# Patient Record
Sex: Female | Born: 1975
Health system: Western US, Academic
[De-identification: ages and names within clinical notes are randomized; demographics above are authoritative.]

---

## 2022-03-21 ENCOUNTER — Ambulatory Visit: Payer: BC Managed Care – HMO

## 2022-03-23 ENCOUNTER — Telehealth: Payer: PRIVATE HEALTH INSURANCE

## 2022-03-23 ENCOUNTER — Ambulatory Visit: Payer: PRIVATE HEALTH INSURANCE | Attending: Student in an Organized Health Care Education/Training Program

## 2022-03-23 DIAGNOSIS — R030 Elevated blood-pressure reading, without diagnosis of hypertension: Secondary | ICD-10-CM

## 2022-03-23 DIAGNOSIS — M7532 Calcific tendinitis of left shoulder: Secondary | ICD-10-CM

## 2022-03-23 DIAGNOSIS — N3281 Overactive bladder: Secondary | ICD-10-CM

## 2022-03-23 DIAGNOSIS — E119 Type 2 diabetes mellitus without complications: Secondary | ICD-10-CM

## 2022-03-23 DIAGNOSIS — R195 Other fecal abnormalities: Secondary | ICD-10-CM

## 2022-03-23 DIAGNOSIS — Z0000 Encounter for general adult medical examination without abnormal findings: Secondary | ICD-10-CM

## 2022-03-23 DIAGNOSIS — M65312 Trigger thumb, left thumb: Secondary | ICD-10-CM

## 2022-03-23 DIAGNOSIS — R011 Cardiac murmur, unspecified: Secondary | ICD-10-CM

## 2022-03-23 DIAGNOSIS — N393 Stress incontinence (female) (male): Secondary | ICD-10-CM

## 2022-03-23 DIAGNOSIS — Z30430 Encounter for insertion of intrauterine contraceptive device: Secondary | ICD-10-CM

## 2022-03-23 DIAGNOSIS — M5416 Radiculopathy, lumbar region: Secondary | ICD-10-CM

## 2022-03-23 MED ORDER — SEMAGLUTIDE(0.25 OR 0.5MG/DOS) 2 MG/3ML SC SOPN
2 mg | SUBCUTANEOUS | 3 refills | Status: AC
Start: 2022-03-23 — End: ?

## 2022-03-23 MED ORDER — ATORVASTATIN CALCIUM 40 MG PO TABS
40 mg | ORAL_TABLET | Freq: Every day | ORAL | 3 refills | Status: AC
Start: 2022-03-23 — End: ?

## 2022-03-23 MED ORDER — LISINOPRIL 20 MG PO TABS
20 mg | ORAL_TABLET | Freq: Every day | ORAL | 3 refills | 30.00000 days | Status: AC
Start: 2022-03-23 — End: ?

## 2022-03-23 MED ORDER — TRIAMTERENE-HCTZ 37.5-25 MG PO CAPS
1 | ORAL_CAPSULE | Freq: Every day | ORAL | 3 refills | Status: AC
Start: 2022-03-23 — End: ?

## 2022-03-23 MED ORDER — MYRBETRIQ 25 MG PO TB24
25 mg | ORAL_TABLET | Freq: Every day | ORAL | 3 refills | Status: AC
Start: 2022-03-23 — End: ?

## 2022-03-23 NOTE — Progress Notes
[facility]  Outpatient Clinic Note    PATIENT:  Jamie Cohen  MRN:  [id number]  DOB:  1976-01-14  DATE OF SERVICE:  03/23/2022    PRIMARY CARE PROVIDER: Lanice Schwab, DO    Cc:    Chief Complaint   Patient presents with    Establish Care     Subjective:     Jamie Cohen is a a 47 y.o. female who presents with   Chief Complaint   Patient presents with    Establish Care     PMHx: HLD, HTN, Overactive Bladder and Stress Incontinence, T2DM   Ozempic x 1 year    Home BP WNL  Copper IUD replaced 06/2021    L hand dominant  R thumb trigger finger s/p 2 CSI last 2022    MRI lumbar spine and L shoulder reviewed  Hand Xrays reviewed     There are no problems to display for this patient.        Allergies   Allergen Reactions    Oxycodone        Medications that the patient states to be currently taking   Medication Sig    valACYclovir 1000 mg tablet          No past medical history on file.    No past surgical history on file.    No family history on file.        There is no immunization history on file for this patient.      Health Maintenance   Topic Date Due    Hepatitis B Screening  Never done    Hepatitis C Screening  Never done    HIV Screening  Never done    Tdap/Td Vaccine (1 - Tdap) Never done    Cervical Ca Screening: HPV Testing  Never done    Cervical Ca Screening: PAP Smear  Never done    COVID-19 Vaccine(Tracks primary and booster doses, not sup/immunocomp) (3 - Moderna series) 09/12/2019    Colorectal Cancer Screening  Never done    Influenza Vaccine (1) Never done    Breast Ca Screening: MAMMOGRAM  04/30/2023         Review of Systems:   ROS    Objective:  BP (!) 180/100  ~ Pulse 93  ~ Temp 36.2 ?C (97.1 ?F)  ~ Resp 16  ~ Wt (!) 279 lb (126.6 kg)  ~ SpO2 95%   Physical Exam        LABS:  No results found for: ''WBC'', ''HGB'', ''HCT'', ''MCV'', ''PLT''  No results found for: ''CREAT'', ''BUN'', ''NA'', ''K'', ''CL'', ''CO2''  No results found for: ''ALT'', ''AST'', ''GGT'', ''ALKPHOS'', ''BILITOT''  No results found for: ''CHOL'', ''CHOLDLCAL'', ''CHOLHDL'', ''TRIGLY''  No results found for: ''TSH''  No results found for: ''HGBA1C''                                   Assessment & Plan:   Diagnoses and all orders for this visit:    Encounter for medical examination to establish care  -     Referral to Obstetrics & Gynecology  -     Referral to Gastroenterology  -     CBC & Auto Differential; Future  -     Comprehensive Metabolic Panel; Future  -     HIV-1/2 Ag/Ab 4th Generation with Reflex Confirmation; Future  -     Lipid Panel;  Future  -     Vitamin D,25-Hydroxy; Future  -     Hgb A1c; Future  -     HCV Antibody Screen; Future  -     HBS Antigen; Future  -     TSH; Future  -     Free T4; Future  -     Vitamin B12; Future  -     Folate,Serum; Future  -     Iron & Iron Binding Capacity; Future  -     Ferritin; Future  -     Albumin/Creat Ratio Ur, LAB COLLECT; Future    Overactive bladder    Stress incontinence in female    Pelvic pain, Copper IUD in situ  -     US pelvis transabd and transvaginal; Future    Passage of loose stools    Type 2 diabetes mellitus treated without insulin (HCC/RAF)    Calcific tendinitis of left shoulder  -     Referral to Orthopaedic Surgery, [facility]    Trigger finger of left thumb  -     Referral to Orthopaedic Surgery, [facility]    Lumbar back pain with radiculopathy affecting right lower extremity  -     Referral to Orthopaedic Surgery, [facility]    Heart murmur  -     Referral to Cardiology    Blood pressure elevated without history of HTN  -     Referral to Cardiology    Other orders  -     semaglutide 0.25 or 0.5mg /dose (OZEMPIC) 2 mg/3 mL SC injection pen; Inject 2 mg under the skin once a week.  -     mirabegron (MYRBETRIQ) 25 mg ER tablet; Take 1 tablet (25 mg total) by mouth daily.  -     lisinopril 20 mg tablet; Take 1 tablet (20 mg total) by mouth daily.  -     atorvastatin (LIPITOR) 40 mg tablet; Take 1 tablet (40 mg total) by mouth daily.  -     triamterene-hydroCHLOROthiazide 37.5-25 mg capsule; Take 1 capsule by mouth daily.      Pap smear result record pending review  Cardiology records pending review     Follow-up: 1 wk after lab draw annual physical, repeat BP, PCV 20      Level of medical decision making based on the evaluation today:  Problem(s) addressed   []  minor/self-limited problem  []  acute uncomplicated illness/injury, or 1 stable chronic illness, or 2 or more self-limited/minor problems  [x]  acute systemic illness or complicated injury or 1 new undiagnosed problem with uncertain prognosis or > 2 stable chronic illness or >1 chronic illness with exacerbation/progression/se of tx  []  acute or chronic illness/injury that poses threat to life or bodily function or > 1 chronic illness with severe exacerbation/progression/or  se of tx     Risk of complication   []  Minimal  []  Low  [x]  Moderate  []  High       30 minutes were spent personally by me today on this encounter which include today's pre-visit review of the chart, obtaining appropriate history, performing an evaluation, documentation and discussion of management with details supported within the note for today's visit. The time documented was exclusive of any time spent on the separately billed procedure.       Signed,  Lanice Schwab, DO   Assistant Professor, [employer] at [facility] [employer] [facility]  Division of Internal Medicine  [facility] [address]  [place], [place] [address]  Tel: [phone]  Fx: [phone]    03/23/2022 9:12 AM

## 2022-03-31 ENCOUNTER — Telehealth: Payer: BC Managed Care – HMO

## 2022-03-31 NOTE — Telephone Encounter
Left V/M with pt asking to call in to schedule a follow up with Dr. Lia Hopping to discuss lab results

## 2022-04-01 ENCOUNTER — Ambulatory Visit: Payer: PRIVATE HEALTH INSURANCE | Attending: Student in an Organized Health Care Education/Training Program

## 2022-04-01 ENCOUNTER — Telehealth: Payer: PRIVATE HEALTH INSURANCE

## 2022-04-01 NOTE — Telephone Encounter
lvm to call back and reschedule appt on 02/07-02/09

## 2022-04-05 ENCOUNTER — Ambulatory Visit: Payer: PRIVATE HEALTH INSURANCE | Attending: Student in an Organized Health Care Education/Training Program

## 2022-04-13 ENCOUNTER — Telehealth: Payer: BC Managed Care – HMO

## 2022-04-14 NOTE — Addendum Note
Addended by: Napoleon Form on: 04/14/2022 08:11 AM     Modules accepted: Orders

## 2022-04-14 NOTE — Telephone Encounter
[facility] representative stated that the referral was created wrong, it should be for the RIGHT THUMB    They also stated the pt mentioned there was a missing referral, for the ankle, they did not specify

## 2022-04-14 NOTE — Addendum Note
Addended by: Napoleon Form on: 04/14/2022 08:12 AM     Modules accepted: Orders

## 2022-04-14 NOTE — Telephone Encounter
Reply by: Napoleon Form  Referrals fixed

## 2022-04-18 ENCOUNTER — Ambulatory Visit: Payer: BC Managed Care – HMO

## 2022-04-19 ENCOUNTER — Ambulatory Visit: Payer: PRIVATE HEALTH INSURANCE | Attending: Student in an Organized Health Care Education/Training Program

## 2022-04-22 ENCOUNTER — Ambulatory Visit: Payer: PRIVATE HEALTH INSURANCE

## 2022-04-25 ENCOUNTER — Ambulatory Visit: Payer: PRIVATE HEALTH INSURANCE

## 2022-04-25 DIAGNOSIS — M7532 Calcific tendinitis of left shoulder: Secondary | ICD-10-CM

## 2022-04-25 DIAGNOSIS — Z719 Counseling, unspecified: Secondary | ICD-10-CM

## 2022-04-25 DIAGNOSIS — M7550 Bursitis of unspecified shoulder: Secondary | ICD-10-CM

## 2022-04-25 DIAGNOSIS — M65311 Trigger thumb, right thumb: Secondary | ICD-10-CM
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
## 2022-04-26 ENCOUNTER — Ambulatory Visit: Payer: BC Managed Care – HMO

## 2022-04-27 ENCOUNTER — Ambulatory Visit: Payer: PRIVATE HEALTH INSURANCE

## 2022-04-28 ENCOUNTER — Telehealth: Payer: PRIVATE HEALTH INSURANCE | Attending: Gastroenterology

## 2022-04-28 ENCOUNTER — Ambulatory Visit: Payer: BC Managed Care – HMO | Attending: Student in an Organized Health Care Education/Training Program

## 2022-04-28 ENCOUNTER — Ambulatory Visit: Payer: PRIVATE HEALTH INSURANCE

## 2022-04-28 DIAGNOSIS — R195 Other fecal abnormalities: Secondary | ICD-10-CM

## 2022-04-28 DIAGNOSIS — R7303 Prediabetes: Secondary | ICD-10-CM

## 2022-04-28 DIAGNOSIS — I10 Essential (primary) hypertension: Secondary | ICD-10-CM

## 2022-04-28 DIAGNOSIS — Z0000 Encounter for general adult medical examination without abnormal findings: Secondary | ICD-10-CM

## 2022-04-28 DIAGNOSIS — Z1211 Encounter for screening for malignant neoplasm of colon: Secondary | ICD-10-CM

## 2022-04-28 DIAGNOSIS — E785 Hyperlipidemia, unspecified: Secondary | ICD-10-CM

## 2022-04-28 MED ORDER — NA SULFATE-K SULFATE-MG SULF 17.5-3.13-1.6 GM/177ML PO SOLN
0 refills | Status: AC
Start: 2022-04-28 — End: ?

## 2022-04-28 NOTE — Progress Notes
Gastroenterology Consult   ATTENDING: Raynelle Fanning Niklaus Mamaril   PATIENT: Jamie Cohen  MRN: [id number]  DOB: 02-05-1976  DATE OF SERVICE: 04/28/2022  REFERRING PROVIDER: Lanice Schwab, DO  PRIMARY CARE PROVIDER: Lanice Schwab, DO     REASON FOR REFERRAL/CHIEF COMPLAINT: Sensitive stomach     History of Present Illness:  Jamie Cohen is a 47 y.o. female with obesity, hypertension and hyperlipidemia who is here for evaluation of loose stools and colon cancer screening.    She reports that every time she eats, she gets the urge to go to the bathroom to empty. She has bowel movements multiple times per day and about 80% are after eating. No mucus or blood. She's had a fissure in the past. She uses Peptobismol with improvement.     No other gastrointestinal symptoms. She reports that the symptoms started about 5 years. These symptoms started occasionally but now is more frequent.    She's only tried a probiotic without helping. No other therapies.     She's never had an EGD or colonoscopy in the past.     No family history of gastrointestinal disorder or GI cancers.    Neg ROS otherwise      Past Medical & Surgical History:  Patient Active Problem List   Diagnosis    Calcific tendonitis of left shoulder    Subscapular bursitis      Past Medical History:   Diagnosis Date    Chicken pox     Headache     Heart murmur     Hyperlipidemia     Hypertension     Sexually transmitted infection     Sleep apnea      Past Surgical History:   Procedure Laterality Date    BREAST SURGERY       Relevant Family History:    []   Family history of colorectal cancer    []   Family history of GI cancer (not CRC)   []   Family history of IBD   []   Family history of celiac disease    Relevant Social History:    Social History     Socioeconomic History    Marital status: Unknown   Tobacco Use    Smoking status: Never    Smokeless tobacco: Never   Substance and Sexual Activity    Alcohol use: Yes     Comment: Rarely, few times a year    Drug use: Never    Sexual activity: Not Currently     Birth control/protection: IUD (non hormonal)   Other Topics Concern    Do you exercise at least a day, 3 or more days a week? No    Do you follow a special diet? No    Vegan? No    Vegetarian? No    Pescatarian? No    Lactose Free? No    Gluten Free? No    Omnivore? Yes     MEDICATIONS:     Medications that the patient states to be currently taking   Medication Sig    atorvastatin (LIPITOR) 40 mg tablet Take 1 tablet (40 mg total) by mouth daily.    lisinopril 20 mg tablet Take 1 tablet (20 mg total) by mouth daily.    mirabegron (MYRBETRIQ) 25 mg ER tablet Take 1 tablet (25 mg total) by mouth daily.    semaglutide 0.25 or 0.5mg /dose (OZEMPIC) 2 mg/3 mL injection pen     semaglutide 0.25 or 0.5mg /dose (OZEMPIC) 2 mg/3 mL SC injection  pen Inject 2 mg under the skin once a week.    triamterene-hydroCHLOROthiazide 37.5-25 mg capsule Take 1 capsule by mouth daily.    valACYclovir 1000 mg tablet       Allergies : is allergic to oxycodone.     PHYSICAL EXAM    There were no vitals filed for this visit.   There is no height or weight on file to calculate BMI.    System Check if examined and normal Positive or additional negative findings   Constit  []  General appearance - normal  Obese   Eyes  [x]  Conjuctivae clear       HENMT  []  Normal Lips/teeth/gums, oropharynx, head     Neck  [x]  Inspection     Resp  [x]  Effort good.      CV  []  Normal Rhythm/rate without murmur   []   Normal pulses     Abdomen  []  Abdomen soft and non-tender   []  Active bowel sounds 4 quadrants   []  No rebound/guarding   []  No appreciable flank or shifting dullness   []  No hepatosplenomegaly   []  No umbilical hernia    Rectal   []  No external hemorrhoids   []  No masses palpated on DRE   []  Brown stool in vault   []   Appropriate resting and squeeze pressure   []   Appropriate bear down maneuver      MSK  [x]  Grossly normal ROM     Neuro   [x]  Grossly normal      Psychiatric  [x]   Oriented to time, place and person   [x]   Appropriate insight/judgement & mood/affect              Lab Review:  Last CBC:   Results for orders placed or performed in visit on 03/30/22   CBC   Result Value Ref Range    White Blood Cell Count 8.32 4.16 - 9.95 x10E3/uL    Red Blood Cell Count 4.93 3.96 - 5.09 x10E6/uL    Hemoglobin 13.8 11.6 - 15.2 g/dL    Hematocrit 03.4 74.2 - 45.2 %    Mean Corpuscular Volume 87.4 79.3 - 98.6 fL    Mean Corpuscular Hemoglobin 28.0 26.4 - 33.4 pg    MCH Concentration 32.0 31.5 - 35.5 g/dL    Red Cell Distribution Width-SD 46.7 36.9 - 48.3 fL    Red Cell Distribution Width-CV 14.5 11.1 - 15.5 %    Platelet Count, Auto 499 (H) 143 - 398 x10E3/uL    Mean Platelet Volume 10.7 9.3 - 13.0 fL    Nucleated RBC%, automated 0.0 No Ref. Range %    Absolute Nucleated RBC Count 0.00 0.00 - 0.00 x10E3/uL    Neutrophil Abs (Prelim) 4.65 See Absolute Neut Ct. x10E3/uL   Differential, Automated   Result Value Ref Range    Neutrophil Percent, Auto 55.9 No Ref. Range %    Lymphocyte Percent, Auto 35.7 No Ref. Range %    Monocyte Percent, Auto 5.3 No Ref. Range %    Eosinophil Percent, Auto 2.3 No Ref. Range %    Basophil Percent, Auto 0.6 No Ref. Range %    Immature Granulocytes% 0.2 No Reference Range %    Absolute Neut Count 4.65 1.80 - 6.90 x10E3/uL    Absolute Lymphocyte Count 2.97 1.30 - 3.40 x10E3/uL    Absolute Mono Count 0.44 0.20 - 0.80 x10E3/uL    Absolute Eos Count 0.19 0.00 - 0.50 x10E3/uL    Absolute  Baso Count 0.05 0.00 - 0.10 x10E3/uL    Absolute Immature Gran Count 0.02 0.00 - 0.04 x10E3/uL   CBC & Auto Differential    Narrative    The following orders were created for panel order CBC & Auto Differential.  Procedure                               Abnormality         Status                     ---------                               -----------         ------                     GEX[[id number]]                          Abnormal            Final result               Differential, Automated[[id number]] Final result                 Please view results for these tests on the individual orders.       Last CMP:   Results for orders placed or performed in visit on 03/30/22   Comprehensive Metabolic Panel   Result Value Ref Range    Sodium 137 135 - 146 mmol/L    Potassium 4.7 3.6 - 5.3 mmol/L    Chloride 101 96 - 106 mmol/L    Total CO2 25 20 - 30 mmol/L    Anion Gap 11 8 - 19 mmol/L    Glucose 124 (H) 65 - 99 mg/dL    Creatinine 0.10 2.72 - 1.30 mg/dL    Estimated GFR >53 See GFR Additional Information mL/min/1.9m2    GFR Additional Information See Comment     Urea Nitrogen 14 7 - 22 mg/dL    Calcium 9.6 8.6 - 66.4 mg/dL    Total Protein 8.1 6.1 - 8.2 g/dL    Albumin 4.2 3.9 - 5.0 g/dL    Bilirubin,Total 0.4 0.1 - 1.2 mg/dL    Alkaline Phosphatase 83 37 - 113 U/L    Aspartate Aminotransferase 26 13 - 62 U/L    Alanine Aminotransferase 19 8 - 70 U/L     Last BMP:    Last LFT:   Lab Results   Component Value Date/Time    TOTPRO 8.1 03/30/2022 08:52 AM    ALBUMIN 4.2 03/30/2022 08:52 AM    BILITOT 0.4 03/30/2022 08:52 AM    ALKPHOS 83 03/30/2022 08:52 AM    AST 26 03/30/2022 08:52 AM    ALT 19 03/30/2022 08:52 AM     Last Cal/iCalMg/Ph:   Lab Results   Component Value Date/Time    CALCIUM 9.6 03/30/2022 08:52 AM     Last Hb A1C:   Lab Results   Component Value Date/Time    HGBA1C 6.4 (H) 03/30/2022 08:52 AM     Last TSH:   Lab Results   Component Value Date/Time    TSH 2.0 03/30/2022 08:52 AM     Last vitamin D:   Lab Results   Component Value Date/Time  VITD25OH 27 03/30/2022 08:52 AM     Last ferritin:   Results for orders placed or performed in visit on 03/30/22   Ferritin   Result Value Ref Range    Ferritin 118 8 - 180 ng/mL     Last TIBC, iron saturation:   Results for orders placed or performed in visit on 03/30/22   Iron & Iron Binding Capacity   Result Value Ref Range    Iron 76 41 - 179 mcg/dL    Iron Binding Capacity 368 262 - 502 mcg/dL    % Saturation 21 %     Last vitamin B12:   Results for orders placed or performed in visit on 03/30/22   Vitamin B12   Result Value Ref Range    Vitamin B12 754 254 - 1,060 pg/mL     Last folate:   Results for orders placed or performed in visit on 03/30/22   [facility]   Result Value Ref Range    Folate,Serum 14.5 8.1 - 30.4 ng/mL     Last ESR:    Last CRP:    Last coags: No results found for: ''PT'', ''APTT'', ''INR''  Last MTB quantiferon gold:    Last celiac Ab panel:    Last fecal calprotectin:    Last C. diff testing:    Last parasite panel:    Last bacterial pathogen panel:      Imaging/GI Studies:  Last Colonoscopy:  , Last EGD:  , Last CT abd/pelvis w/ contrast:  , and Last abdominal ultrasound:         Review of Data:   I have   [x]  Reviewed/ordered []  1 [x]  2 []  ? 3 unique laboratory, radiology, and/or diagnostic tests noted below   [x]  Reviewed [x]  1 []  2 []  ? 3 prior external notes and incorporated into patient assessment   []  Discussed management or test interpretation with external provider(s) as noted       VISIT DIAGNOSES/PROBLEMS ADDRESSED ON 04/28/2022:   Loose stools  Colon cancer screening  Healthcare maintenance    []  New minor/self-limited problem  []  Acute uncomplicated illness/injury   []  Acute systemic illness or complicated injury   []  New problem with uncertain diagnosis  []  New problem that poses threat to life or bodily function      Severity  Not stable/at goal    Social Determinants of Health:  [x]  The diagnosis or treatment of said conditions is significantly limited by social determinants of health     Risk of Complication:   The above diagnoses are deemed to have a risk of complication, morbidity or mortality of:   []  Minimal;    []  Low;     [x]  Moderate;     []  Severe     Assessment & Plan :   Jamie Cohen is a 47 y.o. female with obesity, hypertension and hyperlipidemia who is here for evaluation of loose stools and colon cancer screening.    #Loose stools  - Chronic and occurs after food  - I will order celiac panel  - I will order fecal elastase  - I will order breath testing to rule out SIBO  - We'll do colonoscopy for further evaluation as well per below  - I did recommend adding one tsp of metamucil daily to bulk up the stools  - I also recommended using probiotics daily; recommended either garden of life or VSL    #Colon cancer screening  - I will schedule her for colonoscopy  for screening  - Colonoscopy was discussed in detail with the patient. Risks, benefits and alternatives were also discussed. Risks include, but are not limited to, bleeding, perforation, adverse medication/sedation reaction, and missing lesions including malignancy. We discussed the need for a driver due to sedation. The colonoscopy will be scheduled with MAC and the bowel prep to be used will be Suprep. All questions were answered and the patient agreed to proceed.      - Follow-up after the colonoscopy      The above plan/recommendation(s) were discussed with the patient. The patient had all questions answered satisfactorily and is in agreement with this recommended plan of care.    Wynne Dust, MD   04/28/2022 8:38 AM

## 2022-04-28 NOTE — Progress Notes
[facility]  [address]  [address]  [place], [place] [address]  [phone] Patient:  DOB:  MRN:  Date:  PCP: Jamie Cohen  07/19/1975   [id number]   04/28/2022  Lanice Schwab, DO        CARDIOLOGY OUTPATIENT HISTORY AND PHYSICAL EXAMINATION    REQUESTING PHYSICIAN.  Hadj Rowe Robert, DO    REASON FOR CONSULTATION: HTN    HISTORY OF PRESENT ILLNESS:  Jamie Cohen is a 47 y.o. female with a past medical history significant for HTN, HL who presents for comprehensive cardiovascular evaluation.    Just moved to [place] from [place]. History of heart murmur. Hx of HTN and HL. Automated BP cuffs dont work for her.     Denies chest pain, shortness of breath, dyspnea on exertion, orthopnea, paroxysmal noctural dyspnea, leg swelling, palpitations, lightheadedness, near syncope or syncope.    Exercise: fairly sedentary, busy from work.     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 No drugs. Rare alcohol. No children. One pregnancy. - no complications.     Family history: dad had CHF in old age. Mom died of kidney disease.    Additional ROS negative unless otherwise noted.    MEDICATIONS.   Current Outpatient Medications   Medication Sig    atorvastatin (LIPITOR) 40 mg tablet Take 1 tablet (40 mg total) by mouth daily.    lisinopril 20 mg tablet Take 1 tablet (20 mg total) by mouth daily.    mirabegron (MYRBETRIQ) 25 mg ER tablet Take 1 tablet (25 mg total) by mouth daily.    semaglutide 0.25 or 0.5mg /dose (OZEMPIC) 2 mg/3 mL injection pen     semaglutide 0.25 or 0.5mg /dose (OZEMPIC) 2 mg/3 mL SC injection pen Inject 2 mg under the skin once a week.    sodium sulfate-potassium sulfate-magnesium sulfate solution At 6pm the evening before colonoscopy, drink 16oz x1 dose, then drink 32oz water over 1 hour. At 5am the day of colonoscopy, repeat above.    triamterene-hydroCHLOROthiazide 37.5-25 mg capsule Take 1 capsule by mouth daily.    valACYclovir 1000 mg tablet      No current facility-administered medications for this visit.      ALLERGIES.  Allergies   Allergen Reactions    Oxycodone      PAST MEDICAL HISTORY.  Past Medical History:   Diagnosis Date    Chicken pox     Headache     Heart murmur     Hyperlipidemia     Hypertension     Sexually transmitted infection     Sleep apnea      PAST SURGICAL HISTORY.  Past Surgical History:   Procedure Laterality Date    BREAST SURGERY       FAMILY HISTORY.   Family History   Problem Relation Age of Onset    Hypothyroidism Mother     Kidney disease Father     Hypothyroidism Sister      SOCIAL HISTORY.   Social History     Socioeconomic History    Marital status: Unknown   Tobacco Use    Smoking status: Never    Smokeless tobacco: Never   Substance and Sexual Activity    Alcohol use: Yes     Comment: Rarely, few times a year    Drug use: Never    Sexual activity: Not Currently     Birth control/protection: IUD (non hormonal)   Other Topics Concern    Do you  exercise at least a day, 3 or more days a week? No    Do you follow a special diet? No    Vegan? No    Vegetarian? No    Pescatarian? No    Lactose Free? No    Gluten Free? No    Omnivore? Yes       PHYSICAL EXAMINATION.    Vitals Current      Temp           BP     150/70       HR    85      RR           Sats    94 %      Weight       There is no height or weight on file to calculate BMI.     System Check if normal Positive or additional negative findings   Constit  [x]  General appearance     Eyes  []  Conj/Lids []  Pupils  []  Fundi     HEENT  [x]  External ears/nose []  Otoscopy   [x]  Gross Hearing []  Nasal mucosa   []  Lips/teeth/gums []  Oropharynx    []  mucus membranes [x]  Head     Neck  []  Inspection/palpation []  Thyroid     Resp  [x]  Effort []  Wheezing    [x]  Auscultation  []  Crackles     CV  [x]  Rhythm/rate   []  Murmurs   []  Lower extremity edema   [x]  JVP non-elevated    [x]  Carotid  auscultation normal   Normal pulses:   [x]  Radial []  Femoral  []  Pedal      GI  []  abd masses []  tenderness   []  rebound/guarding   []  Liver/spleen []  Rectal     Neuro  []  CN2-12 intact grossly   [x]  Alert and oriented   []  DTR      []  Muscle strength      []  Sensation   []  Gait/balance     Psych  [x]  Insight/judgement     []  Mood/affect    [x]  Gross cognition          LABORATORY STUDIES.   Reviewed during this encounter    Complete Blood Count Metabolic Panel Lipid Analysis   Lab Results   Component Value Date    WBC 8.32 03/30/2022    HGB 13.8 03/30/2022    HCT 43.1 03/30/2022    PLT 499 (H) 03/30/2022    Lab Results   Component Value Date    NA 137 03/30/2022    K 4.7 03/30/2022    BUN 14 03/30/2022    CREAT 0.70 03/30/2022    GLUCOSE 124 (H) 03/30/2022    AST 26 03/30/2022    ALT 19 03/30/2022    ALKPHOS 83 03/30/2022    BILITOT 0.4 03/30/2022    Lab Results   Component Value Date    CHOL 193 03/30/2022    CHOLDLCAL 126 (H) 03/30/2022    CHOLHDL 44 (L) 03/30/2022    NOHDLCHOCAL 149 (H) 03/30/2022    TRIGLY 114 03/30/2022      No results found for: ''TROPONIN'', ''BNP''  Lab Results   Component Value Date    HGBA1C 6.4 (H) 03/30/2022       DIAGNOSTIC STUDIES.    TTE Reviewed by me  No results found.     ACC/AHA Cholesterol Management Guideline Recommendations:   Advises that moderate-statin therapy can be considered because 10 year  ASCVD risk is between 5% and 10%. Patient is currently receiving guideline-directed statin therapy.    ASCVD Risk Score    10-year ASCVD risk  is 7.08% as of 1:43 PM on 04/28/2022.   10-year ASCVD risk with optimal risk factors  is 0.5%.   Values used to calculate ASCVD Risk Score    Age  61 y.o.     Gender  female   Race  African American   HDL Cholesterol  44 mg/dL. (measured on 03/30/2022)   LDL Cholesterol  126 mg/dL. (measured on 03/30/2022)   Total Cholesterol  193 mg/dL. (measured on 03/30/2022)   Systolic Blood Pressure  150 mm Hg. (measured on 04/28/2022)   Blood Pressure Medication Present  Yes   Smoking Status  currently not a smoker   Diabetes Present  No     Click here for the [facility] ASCVD Cardiovascular Risk Estimator Plus tool Office manager).         ECG 04/28/22 Reviewed by me: Normal sinus rhythm 81 bpm,       IMPRESSION  Jamie Cohen is a 47 y.o. female with:  HTN  HL - LDL 126 without statin  prediabetes    RECOMMENDATIONS/PLAN  - lisinopril 20 mg  - triamterene-HCTZ 37.5-25 mg -> takes half tablet  - atorvastatin 40 mg -> repeat lipids and A1C around 07/2022  - echo and stress echo (given sedentary)  - Counseled patient on importance of diet and exercise and encouraged adherence to a heart-healthy diet.  - return visit after above studies done    The above recommendations were discussed in detail with the patient.  All questions were answered satisfactorily and the patient is in full agreement with this recommended plan of care.    Thank you, Lanice Schwab, DO for involving me in your patient's care.  Please feel free to contact me with any questions or concerns.    AUTHOR:   Roseanne Reno, MD  Clinical Instructor   [facility]  [address]  [address]  [place], [place] [address]  [phone]    04/28/2022 1:43 PM    Complex medical decision making

## 2022-04-29 ENCOUNTER — Ambulatory Visit: Payer: PRIVATE HEALTH INSURANCE | Attending: Student in an Organized Health Care Education/Training Program

## 2022-04-29 ENCOUNTER — Telehealth: Payer: PRIVATE HEALTH INSURANCE

## 2022-04-29 ENCOUNTER — Telehealth: Payer: BC Managed Care – HMO

## 2022-04-29 DIAGNOSIS — Z6841 Body Mass Index (BMI) 40.0 and over, adult: Secondary | ICD-10-CM

## 2022-04-29 DIAGNOSIS — D75839 Thrombocytosis: Secondary | ICD-10-CM

## 2022-04-29 DIAGNOSIS — Z7182 Exercise counseling: Secondary | ICD-10-CM

## 2022-04-29 DIAGNOSIS — Z713 Dietary counseling and surveillance: Secondary | ICD-10-CM

## 2022-04-29 DIAGNOSIS — E7800 Pure hypercholesterolemia, unspecified: Secondary | ICD-10-CM

## 2022-04-29 DIAGNOSIS — E119 Type 2 diabetes mellitus without complications: Secondary | ICD-10-CM

## 2022-04-29 MED ORDER — MOUNJARO 7.5 MG/0.5ML SC SOPN
7.5 mg | SUBCUTANEOUS | 0 refills | Status: AC
Start: 2022-04-29 — End: ?

## 2022-04-29 NOTE — Progress Notes
[facility]  Outpatient Clinic Note    PATIENT:  Jamie Cohen  MRN:  [id number]  DOB:  1975/03/29  DATE OF SERVICE:  04/29/2022    PRIMARY CARE PROVIDER: Lanice Schwab, DO    Cc:    Chief Complaint   Patient presents with    Follow-up     Lab results and would like to note with Lipitor needs brand not generic     Subjective:     Jamie Cohen is a a 47 y.o. female who presents with   Chief Complaint   Patient presents with    Follow-up     Lab results and would like to note with Lipitor needs brand not generic     PMHx: HLD, HTN, Overactive Bladder and Stress Incontinence, T2DM   Ozempic x 1 year, tolerated titration, did not notice targeted weight loss from initial weight 280 lbs     Home BP WNL  Copper IUD replaced 06/2021    L hand dominant  R thumb trigger finger s/p 2 CSI last 2022    MRI lumbar spine and L shoulder reviewed  Hand Xrays reviewed     Patient Active Problem List    Diagnosis Date Noted    Calcific tendonitis of left shoulder 04/25/2022    Subscapular bursitis 04/25/2022           Allergies   Allergen Reactions    Atorvastatin Diarrhea and Nausea And Vomiting     Patient states tolerates BRAND only    Oxycodone        Medications that the patient states to be currently taking   Medication Sig    atorvastatin (LIPITOR) 40 mg tablet Take 1 tablet (40 mg total) by mouth daily.    lisinopril 20 mg tablet Take 1 tablet (20 mg total) by mouth daily.    mirabegron (MYRBETRIQ) 25 mg ER tablet Take 1 tablet (25 mg total) by mouth daily.    semaglutide 0.25 or 0.5mg /dose (OZEMPIC) 2 mg/3 mL SC injection pen Inject 2 mg under the skin once a week.    triamterene-hydroCHLOROthiazide 37.5-25 mg capsule Take 1 capsule by mouth daily.    valACYclovir 1000 mg tablet          Past Medical History:   Diagnosis Date    Chicken pox     Headache     Heart murmur     Hyperlipidemia     Hypertension     Sexually transmitted infection     Sleep apnea        Past Surgical History:   Procedure Laterality Date BREAST SURGERY         Family History   Problem Relation Age of Onset    Hypothyroidism Mother     Kidney disease Father     Hypothyroidism Sister          Immunization History   Administered Date(s) Administered    COVID-19, mRNA, (Moderna) 100 mcg/0.5 mL 06/20/2019, 07/18/2019         Health Maintenance   Topic Date Due    Tdap/Td Vaccine (1 - Tdap) Never done    Cervical Ca Screening: HPV Testing  Never done    Cervical Ca Screening: PAP Smear  Never done    Colorectal Cancer Screening  Never done    Influenza Vaccine (1) 10/29/2021    COVID-19 Vaccine(Tracks primary and booster doses, not sup/immunocomp) (3 - 2023-24 season) 10/29/2021    Breast Ca Screening: MAMMOGRAM  04/30/2023  Prediabetes Screening (See hover text)  03/30/2025    Hepatitis B Screening  Completed    Hepatitis C Screening  Completed    HIV Screening  Completed         Review of Systems:   ROS    Objective:  BP 134/85 (BP Location: Right arm, Patient Position: Sitting, Cuff Size: Large)  ~ Pulse 75  ~ Temp 36.6 ?C (97.8 ?F) (Temporal)  ~ Resp 18  ~ Ht 5' 4'' (1.626 m)  ~ Wt (!) 278 lb 12.8 oz (126.5 kg)  ~ LMP 03/28/2022 (Within Days)  ~ SpO2 97%  ~ BMI 47.86 kg/m?   Physical Exam        LABS:  Lab Results   Component Value Date    WBC 8.32 03/30/2022    HGB 13.8 03/30/2022    HCT 43.1 03/30/2022    MCV 87.4 03/30/2022    PLT 499 (H) 03/30/2022     Lab Results   Component Value Date    CREAT 0.70 03/30/2022    BUN 14 03/30/2022    NA 137 03/30/2022    K 4.7 03/30/2022    CL 101 03/30/2022    CO2 25 03/30/2022     Lab Results   Component Value Date    ALT 19 03/30/2022    AST 26 03/30/2022    ALKPHOS 83 03/30/2022    BILITOT 0.4 03/30/2022     Lab Results   Component Value Date    CHOL 193 03/30/2022    CHOLDLCAL 126 (H) 03/30/2022    CHOLHDL 44 (L) 03/30/2022    TRIGLY 114 03/30/2022     Lab Results   Component Value Date    TSH 2.0 03/30/2022     Lab Results   Component Value Date    HGBA1C 6.4 (H) 03/30/2022 Assessment & Plan:   Diagnoses and all orders for this visit:    Type 2 diabetes mellitus treated without insulin (HCC/RAF)  -     tirzepatide (MOUNJARO) 7.5 mg/0.5 mL injection; Inject 0.5 mLs (7.5 mg total) under the skin once a week.  -     Hgb A1c; Standing    Pure hypercholesterolemia  -     tirzepatide (MOUNJARO) 7.5 mg/0.5 mL injection; Inject 0.5 mLs (7.5 mg total) under the skin once a week.  -     Hgb A1c; Standing    Thrombocytosis  -     Hgb A1c; Standing  -     Referral to Hematology    BMI 45.0-49.9, adult (HCC/RAF)  -     tirzepatide (MOUNJARO) 7.5 mg/0.5 mL injection; Inject 0.5 mLs (7.5 mg total) under the skin once a week.    Annual physical exam    Dietary counseling    Exercise counseling    > HgbA1C at goal <7% on Ozempic   D/C Ozempic given no response to weight loss x 1 year; Switch to Mounjaro 7.5 mg weekly with plan to titrate dose monthly as tolerated   Continue Lisinopril 20 mg daily and Statin therapy  PCV 20 at next visit   Pap smear result record pending review  Cardiology records pending review     Follow-up: 1 month Mounjaro titration, 3 months weight check, A1C and  PCV 20 ( annual physical)     Level of medical decision making based on the evaluation today:  Problem(s) addressed   []  minor/self-limited problem  []  acute uncomplicated illness/injury, or 1 stable chronic illness, or 2 or more  self-limited/minor problems  [x]  acute systemic illness or complicated injury or 1 new undiagnosed problem with uncertain prognosis or > 2 stable chronic illness or >1 chronic illness with exacerbation/progression/se of tx  []  acute or chronic illness/injury that poses threat to life or bodily function or > 1 chronic illness with severe exacerbation/progression/or  se of tx     Risk of complication   []  Minimal  []  Low  [x]  Moderate  []  High       30 minutes were spent personally by me today on this encounter which include today's pre-visit review of the chart, obtaining appropriate history, performing an evaluation, documentation and discussion of management with details supported within the note for today's visit. The time documented was exclusive of any time spent on the separately billed procedure.       Signed,  Lanice Schwab, DO   Assistant Professor, [employer] at [facility] [employer] [facility]  Division of Internal Medicine    [facility] [address]  [place], [place] [address]  Tel: [phone]  Fx: [phone]    04/29/2022 2:03 PM

## 2022-04-29 NOTE — Telephone Encounter
Hi Team,    Please reach out to patient to schedule procedure    Thank you!

## 2022-04-30 ENCOUNTER — Ambulatory Visit: Payer: PRIVATE HEALTH INSURANCE

## 2022-04-30 NOTE — Telephone Encounter
Reached out to confirm pts follow up appointment changing to annual physical per MD

## 2022-05-02 ENCOUNTER — Ambulatory Visit: Payer: PRIVATE HEALTH INSURANCE

## 2022-05-03 ENCOUNTER — Ambulatory Visit: Payer: BC Managed Care – HMO

## 2022-05-04 ENCOUNTER — Telehealth: Payer: PRIVATE HEALTH INSURANCE

## 2022-05-04 NOTE — Progress Notes
Date:05/10/2022  Name:Jamie Cohen  MRN: [id number]  DOB: September 26, 1975  Age: 47 y.o.      Jamie Arave A.  Jamie Fusi, MD    The patient was seen in our office today.     Jamie Cohen was seen in my office today 05/10/2022 for Orthopedic Consultation at the request of Jamie Cohen, Jamie Seton, DO for my opinion and advice on her complaints of right hand pain.    Chief Complaint: Right hand pain    History: Jamie Cohen presents today regarding the above chief complaints. Jamie Cohen is a 47 y.o. female who presents with a 2 month history of symptoms after no injury.    The pain is localized in the right hand. She notes swelling and notes numbness. Moreover, the symptoms are exacerbated by use and are alleviated by rest.    Patient Medical History: The patient?s intake sheet, including past medical history, past surgical history, medicines, allergies, social and family history was reviewed by myself and the patient today, these are noted and documented in the patient?s file.    Review of Systems: The review of systems is remarkable for the positive orthopedic problems discussed above. A full review of systems with respect to Constitutional, ENT, Cardiovascular, GU, Skin, Neurologic, Endocrine, Hematologic, Psychiatric, Gastrointestinal, Respiratory, Eyes and Allergic/Immunologic systems is noted, reviewed and documented in the patient?s file.    Physical Exam: The patient is 5 feet 4 inches    Constitutional:  Well appearing, no apparent distress.  The patient?s general appearance is well dressed well nourished. The body habitus is normal.    Psychiatric: Patient is alert and oriented with a pleasant mood and affect.      Eyes: Extraocular movements are intact, pupils are symmetric.     Neck: Supple, trachea midline.      Vascular: Fingers pink warm and well perfused.     Respiratory: Respirations are regular & unlabored. Respiratory effort is normal.    Skin: No lesions or rash noted. Normal color temperature and texture. Intact.    Musculoskeletal and Neurological: Sensation intact to light touch. Flexion and extension grossly intact bilateral upper extremities.     + Tinel right carpal tunnel.  Tenderness, swelling, and cyst right thumb flexor sheath.    Test Results: None.     Radiographs: 3 views of the right hand which were ordered, obtained and evaluated by me here in the [facility] office today shows no evidence of fracture, arthritis or subluxation.    Impression:  Right carpal tunnel syndrome  Right thumb flexor tenosynovitis with cyst    Plan: Findings, diagnoses, prognosis, and treatment options were reviewed and discussed with the patient, with all questions answered.    Plan for an electrodiagnostic study of the right upper extremity.    Submit authorization request for steroid injections of the right carpal tunnel and right thumb flexor sheath and cyst.    Patient elected to proceed with injection under ultrasound guidance.      Prior to the procedure, the risks, benefits, alternatives to care, and potential complications, including possible permanent skin hypopigmentation and fat/skin atrophy as well as infection or injury to neurovascular or other critical structures were discussed with patient and informed consent was obtained.     The Sonosite M-MSK portable ultrasound unit with a MSK probe was used to perform a standard, dynamic ultrasound.     Under direct ultrasound guidance, the right thumb flexor sheath and cyst were visualized.  Under sterile technique, a 25 gauge needle was used  to inject 1cc Kenalog 40mg  and 1cc 1% lidocaine into the right thumb flexor sheath and cyst, ensuring correct needle placement and injection, without complication.    Next Appointment: 1-2 weeks CL/KW  [x]  Reassessment of pain  [x]  No X-Ray  []  X-rays     I recommend a course of treatment as checked below:     []  Splinting/Bracing/Immobilization   [x]  Aspiration/Injection  []  Fracture Care  []  Decision for Surgery  []  Referral to PT/OT for formal program  []  Imaging study  [x]  Consultation for electrodiagnostic studies  []  Inflammatory labs  []  Prescription: Meloxicam 7.5mg  orally once daily for 30 days  []  Prescription: Norco 5/325mg  as needed orally once every 6 hours for pain  []  Prescription: Tramadol 50mg  as needed orally every 6 hours for pain  []  Prescription: Voltaren Gel        Sincerely,         ___________________  Jamie Cohen. Jamie Cohen, M.D.  Hand and Upper Extremity Specialist, Orthopedic Surgery    CC: Jamie Schwab, DO

## 2022-05-04 NOTE — Telephone Encounter
Left VM for patient to call [facility] scheduling team back. If the patient returns our call to a different office, please transfer call to [phone] so that we may assist. This number is for internal use only; please do not give this number to patients.  If pt would like to call back provide [phone].  Thank you.

## 2022-05-04 NOTE — Telephone Encounter
-----   Message from Laury Axon, MD sent at 04/28/2022  8:58 AM PST -----  Can you schedule patient for colonoscopy with me at [facility]? Thank you.

## 2022-05-09 NOTE — Telephone Encounter
PRIOR AUTHORIZATION:  SUBMITTED/PENDING  Medication:  Mounjaro  Submitted Prior Auth for medication for patient through [email or web address]   Josem Kaufmann Code: [id number])  Date submitted:  05/09/2022   Patient has been notified of submission.    Jacqualyn Posey de Milbert Coulter 05/09/2022 1:40 PM

## 2022-05-10 ENCOUNTER — Ambulatory Visit: Payer: BC Managed Care – HMO | Attending: Hand Surgery

## 2022-05-10 DIAGNOSIS — M67442 Ganglion, left hand: Secondary | ICD-10-CM

## 2022-05-10 NOTE — Telephone Encounter
Prior Authorization for Jamie Cohen was approved by insurance and scanned into patient's chart. Patient notified      Silas [place], [place] 05/10/2022 10:54 AM

## 2022-05-14 ENCOUNTER — Ambulatory Visit: Payer: PRIVATE HEALTH INSURANCE | Attending: Gastroenterology

## 2022-05-16 ENCOUNTER — Ambulatory Visit: Payer: PRIVATE HEALTH INSURANCE

## 2022-05-23 ENCOUNTER — Ambulatory Visit: Payer: BC Managed Care – HMO

## 2022-05-25 ENCOUNTER — Ambulatory Visit: Payer: PRIVATE HEALTH INSURANCE | Attending: Pain Medicine

## 2022-05-25 DIAGNOSIS — G5601 Carpal tunnel syndrome, right upper limb: Secondary | ICD-10-CM

## 2022-05-25 DIAGNOSIS — M79601 Pain in right arm: Secondary | ICD-10-CM

## 2022-05-25 DIAGNOSIS — G5602 Carpal tunnel syndrome, left upper limb: Secondary | ICD-10-CM

## 2022-05-25 NOTE — Progress Notes
[employer], D.O.    Test Date: 05/25/2022 4:04 PM    Patient: Jamie Cohen DOB: 06/06/1975 Physician: Daleen Snook, D.O.   Gender: Female Height: 5 feet 4 inch Ref Phys: Dr. [name], MD    ID#: [id number]               Patient History [facility], a 47 Years old Female presents today at the request of Dr. [name], MD  for Physical Medicine and Rehabilitation consultation with consideration of electrodiagnostic testing (EMG and NCV studies) to access for possible radiculopathy, peripheral nerve entrapments or injuries, or other neuromuscular disorders.    See separate note for complete history.    Exam     See separate note for physical exam findings.        EMG and NCV Findings:    The motor conduction test was performed on 4 nerve(s). The results were normal in 3 nerve(s): R Ulnar - ADM, L Median - APB, L Ulnar - ADM. Results outside the specified normal range were found in 1 nerve(s), as follows:  In the R Median - APB study  the take off latency result was increased for Wrist stimulation  the take off velocity result was reduced for Elbow - Wrist segment    The sensory conduction test was performed on 6 nerve(s). The results were normal in 4 nerve(s): R Ulnar - Dig V (Antidromic), R Radial - Superficial (Antidromic), L Ulnar - Dig V (Antidromic), L Radial - Superficial (Antidromic). Results outside the specified normal range were found in 2 nerve(s), as follows:  In the R Median - Dig III (Antidromic) study  the peak latency result was increased for Wrist stimulation  the peak amplitude result was reduced for Wrist stimulation  the peak amplitude result was reduced for Palm stimulation  In the L Median - Dig III (Antidromic) study  the peak latency result was increased for Wrist stimulation  the peak amplitude result was reduced for Wrist stimulation  the peak latency result was increased for Palm stimulation    The needle EMG study was normal in all 9 tested muscles: R. Deltoid, R. Biceps brachii, R. Triceps brachii, R. Brachioradialis, R. Pronator teres, R. Extensor digitorum communis, R. First dorsal interosseous, R. Abductor pollicis brevis    [facility] [id number] 05/25/2022 4:04 PM     3 of 4  Impression     This is an abnormal electrodiagnostic examination.   There is electrodiagnostic evidence suggestive of a right median neuropathy across the wrist, affecting the sensory and motor fibers. There is evidence of both demyelination and axon loss. These findings are consistent with a diagnosis of right carpal tunnel syndrome, and the electrodiagnostic abnormalities are moderate. There is no electrodiagnostic evidence of a right ulnar neuropathy across the wrist or elbow. There is no electrodiagnostic evidence of a right cervical radiculopathy.   Comparative testing to the contralateral limb suggests a mild left carpal syndrome. Correlate clinically.     These findings were discussed with the patient, and all questions related to the study were answered.     _____________________________    Daleen Snook, DO  Physical Medicine & Rehabilitation  Interventional Spine & Sports Medicine      [facility] [id number] 05/25/2022 4:04 PM     3 of 4         Sensory NCS      Nerve / Sites Rec. Site Onset Lat Ref. Peak Lat Ref.  O-P Amp Ref. Segments Delta-D Distance Vel     ms ms ms ms ?V ?V  ms cm m/s   R Median - Dig III (Antidromic)      Wrist Index 4.6 ?3.2 5.9 ?4.0 10.8 ?15.0 Wrist - Index 4.6 14 31       Palm Index 1.9 ?1.8 2.2 ?2.4 5.1 ?7.0 Palm - Index 1.9 7 38   R Ulnar - Dig V (Antidromic)      Wrist Dig V 2.8 ?3.1 3.4 ?4.0 17.6 ?11.0 Wrist - Dig V 2.8 14 51   R Radial - Superficial (Antidromic)      Forearm Wrist 1.5 ?2.2 2.0 ?2.8 27.0 ?7.0 Forearm - Wrist 1.5 10 66   L Median - Dig III (Antidromic)      Wrist Index 4.1 ?3.2 5.1 ?4.0 7.7 ?15.0 Wrist - Index 4.1 14 34      Palm Index 1.5 ?1.8 2.4 ?2.4 15.9 ?7.0 Palm - Index 1.5 7 47   L Ulnar - Dig V (Antidromic)      Wrist Dig V 2.5 ?3.1 3.2 ?4.0 18.3 ?11.0 Wrist - Dig V 2.5 14 57   L Radial - Superficial (Antidromic)      Forearm Wrist 2.0 ?2.2 2.5 ?2.8 17.0 ?7.0 Forearm - Wrist 2.0 10 50       Motor NCS      Nerve / Sites Muscle Latency Ref. Amplitude Ref. Segments Lat Diff Distance Velocity Ref. Temp.     ms ms mV mV  ms cm m/s m/s ?C   R Median - APB      Wrist APB 4.5 ?4.4 7.3 ?4.2 Wrist - APB  8         Elbow APB 9.0  6.8  Elbow - Wrist 4.5 22.5 50 ?51    R Ulnar - ADM      Wrist ADM 2.1 ?3.7 8.0 ?7.9 Wrist - ADM  8   33      B.Elbow ADM 6.1  7.9  B.Elbow - Wrist 4.0 21 52 ?52       A.Elbow ADM 7.5  7.9  A.Elbow - B.Elbow 1.4 10 71 ?43    L Median - APB      Wrist APB 4.1 ?4.4 9.0 ?4.2 Wrist - APB  8         Elbow APB 8.6  7.8  Elbow - Wrist 4.5 23 51 ?51    L Ulnar - ADM      Wrist ADM 2.2 ?3.7 7.9 ?7.9 Wrist - ADM  8         B.Elbow ADM 5.8  8.1  B.Elbow - Wrist 3.7 21 57 ?52       A.Elbow ADM 7.1  7.4  A.Elbow - B.Elbow 1.3 10 79 ?43        EMG     Spontaneous MUAP Recruitment .   Muscle Nerve Roots IA Fib PSW Amp Dur. Poly Recrt Comment   R. Deltoid Axillary C5-C6 None None None Nml Nml 0 Nml .   R. Biceps brachii Musculocutaneous C5-C6 None None None Nml Nml 0 Nml .   R. Triceps brachii Radial C6-C8 None None None Nml Nml 0 Nml .   R. Brachioradialis Radial C5-C6 None None None Nml Nml 0 Nml .   R. Pronator teres Median C6-C7 None None None Nml Nml 0 Nml .   R. Extensor digitorum communis Radial C7-C8 None None None Nml Nml 0 Nml .   R. First  dorsal interosseous Ulnar C8-T1 None None None Nml Nml 0 Nml .   R. Abductor pollicis brevis Median C8-T1 None None None Nml Nml 0 Nml .                               Procedures

## 2022-05-25 NOTE — Progress Notes
CONSULTATION AND ELECTRODIAGNOSTIC EVALUATION   Daleen Snook, DO   PHYSICAL MEDICINE & REHABILITATION   INTERVENTIONAL SPINE & SPORTS MEDICINE   Date:  05/25/2022    Name:  Jamie Cohen  MRN#: [id number]  DOB:  11-15-1975  Age:  47 y.o.        Dear Dr. [name], Cruzita Lederer., MD and Dr. Riccardo Dubin, Alric Seton, DO,    I had the pleasure of seeing your patient [facility] for initial consultation and electrodiagnostic evaluation. Thank you very much for this consultation. Please find my consultation note below.     The patient was seen in [facility].                                                         Chief Complaint   Patient presents with    Right Hand - Pain       History: Jamie Cohen is a 47 y.o. female who presents today regarding the above chief complaints and for an electrodiagnostic nerve study of the right upper extremity. Patient notes 2 month history of symptoms after no injury. The pain is localized in the right hand. She notes swelling and notes numbness. Moreover, the symptoms are exacerbated by use and are alleviated by rest. Denies any new symptoms today otherwise.     Patient Medical History: The patient?s intake sheet, including past medical history, past surgical history, medicines, allergies, social and family history was reviewed by myself and the patient today, these are noted and documented in the patient?s file.    Review of Systems: The review of systems is remarkable for the positive orthopedic problems discussed above. A full review of systems with respect to Constitutional, ENT, Cardiovascular, GU, Skin, Neurologic, Endocrine, Hematologic, Psychiatric, Gastrointestinal, Respiratory, Eyes and Allergic/Immunologic systems is noted, reviewed and documented in the patient?s file.      PHYSICAL EXAMINATION:    The patient?s general appearance is normal. The patient is alert and oriented x 3, with a normal mood and affect.  The body habitus is medium. There is no lympadenopathy, lymphedema or respiratory distress. Normal pulses and capillary refill.  Observation of the extremity reveals no gross abnormality or atrophy.  Skin is without deformity or discoloration. The tone and turgor of the skin is normal.  Palpation is remarkable for generalized, diffuse soft tissue achiness and soreness throughout the arm in a non-specific fashion.  There is free range of motion in all the digits of the hand, to include complete flexion and extension of all fingers and the thumb. There is no intrinsic muscle weakness or atrophy.  Motor examination revealed 5/5 strength.  Sensory: Sensibility is well-preserved to light touch and sharpness.     The vascular status is intact. There are no vasomotor or pseudomotor disturbances.   + Tinel right carpal tunnel.  Tenderness, swelling, and cyst right thumb flexor sheath.    Imaging     I independently reviewed the 3 view x-rays of the right hand from 05/10/22 in the office today. Per my interpretation no evidence of fracture, arthritis or subluxation.      EMG Impression of RUE from 05/25/22    This is an abnormal electrodiagnostic examination.   There is electrodiagnostic evidence suggestive of a right median neuropathy across the wrist, affecting the  sensory and motor fibers. There is evidence of both demyelination and axon loss. These findings are consistent with a diagnosis of right carpal tunnel syndrome, and the electrodiagnostic abnormalities are moderate. There is no electrodiagnostic evidence of a right ulnar neuropathy across the wrist or elbow. There is no electrodiagnostic evidence of a right cervical radiculopathy.   Comparative testing to the contralateral limb suggests a mild left carpal syndrome. Correlate clinically.     These findings were discussed with the patient, and all questions related to the study were answered.     Impression   Right carpal tunnel syndrome  Right thumb flexor tenosynovitis with cyst  Left carpal tunnel syndrome      PLAN:    [x] EMG of RUE recommended. Informed consent provided, patient agreed to proceed today. No complications noted during examination.   [x] Patient education completed   [x] Activity modification to avoid aggravation of the above condition  [x] Physical therapy/Home exercise program for above condition recommended (provided AAOS recommended exercises)  [x] Follow up with referring physician  [x] Results were explained and given to the patient   [] I explained that a normal electrodiagnostic study does not preclude the possibility of an underlying radiculitis/neuritis. Recommend correlation with clinical examination and imaging studies.  [x] Recommended bracing at night or PRN  [x] Improving since CSIx1  [x] Discussed that if surgical intervention not indicated, patient may benefit from possible injection    Medical Decision Making addressed:      Number and Complexity of Problems Addressed at the Encounter:   Level 3:  []   2 self-limited/minor  []   1 chronic stable  []   1 acute illness/injury  Level 4:   []   1 or more chronic unstable  [x]   2 or more stable chronic illness  []   1 undiagnosed problem with uncertain diagnosis  []   1 acute illness with systemic symptoms  []   1 acute complicated injury   Level 5:   []   1 or more chronic illness with severe exacerbation  []   1 acute or chronic illness or injury that poses a threat to life or bodily function      Review of Data:                Level 3 (2 cat 1), level 4 (3 cat 1), level 5 (3 cat 1 and 2 categories)               Category 1:  []  I have reviewed/ordered []  1 []  2 []  ? 3 unique laboratory, radiology, and/or diagnostic tests   []  I have reviewed []  1 []  2 []  ? 3 prior external notes and incorporated into patient assessment    Category 2:  [x]  I have independently interpreted test performed by other physician(s) as noted   Category 3:  []  I have had a discussion of care with external provider(s) as noted       Risk of Complication and or Morbidity or Mortality of Patient Management including Social Determinants of Health:   I deem the above diagnoses to have a risk of complication, morbidity or mortality of []  Minimal   []  Low (3)     [x]  Moderate (4)   []  Severe (5)  []  The diagnosis or treatment of said conditions is significantly limited by social determinants of health as noted      Thank you for allowing me to participate in the care of your patient. Please do not hesitate to contact me with any questions or concerns.  Sincerely,       Daleen Snook, DO  Physical Medicine & Rehabilitation  Interventional Spine & Sports Medicine

## 2022-05-27 ENCOUNTER — Ambulatory Visit: Payer: PRIVATE HEALTH INSURANCE

## 2022-06-03 ENCOUNTER — Ambulatory Visit: Payer: PRIVATE HEALTH INSURANCE | Attending: Student in an Organized Health Care Education/Training Program

## 2022-06-06 ENCOUNTER — Ambulatory Visit: Payer: BC Managed Care – HMO | Attending: Medical

## 2022-06-10 ENCOUNTER — Ambulatory Visit: Payer: PRIVATE HEALTH INSURANCE

## 2022-06-13 ENCOUNTER — Ambulatory Visit: Payer: PRIVATE HEALTH INSURANCE | Attending: Medical

## 2022-06-13 DIAGNOSIS — G5602 Carpal tunnel syndrome, left upper limb: Secondary | ICD-10-CM

## 2022-06-13 DIAGNOSIS — M659 Synovitis and tenosynovitis, unspecified: Secondary | ICD-10-CM

## 2022-06-13 DIAGNOSIS — G5601 Carpal tunnel syndrome, right upper limb: Secondary | ICD-10-CM

## 2022-06-13 MED ADMIN — TRIAMCINOLONE ACETONIDE 40 MG/ML IJ SUSP: INTRA_ARTICULAR | @ 23:00:00 | Stop: 2022-06-13 | NDC 00003029320

## 2022-06-13 MED ADMIN — LIDOCAINE HCL 1 % IJ SOLN: INTRA_ARTICULAR | @ 23:00:00 | Stop: 2022-06-13 | NDC 00409427602

## 2022-06-13 NOTE — Progress Notes
Date:  06/13/2022    Name:  Jamie Cohen  MRN:   [id number]  DOB:   [birth year]  Age:   47 y.o.      Jamie A.  Jamie Fusi, MD  [name], PA-C    The patient was seen in our office by [name], PA-C for [name], MD.      Chief Complaint: Right hand pain     History: [facility] presents today for evaluation regarding the above chief complaint.Jamie Cohen is a 46 y.o. female who presents with a 2 month history of symptoms after no injury.     The pain is localized in the right hand. She notes swelling and notes numbness. Moreover, the symptoms are exacerbated by use and are alleviated by rest.      Past Medical History and Review of Systems: The patient?s intake sheet, including past medical history, past surgical history, medicines, allergies, social and family history and review of systems was reviewed by myself with the patient today. There are no pertinent changes from the previous visit. Details are noted and documented in the patient?s file.    Past Medical History:   Diagnosis Date    Chicken pox     Headache     Heart murmur     Hyperlipidemia     Hypertension     Sexually transmitted infection     Sleep apnea        Social History     Socioeconomic History    Marital status: Other   Tobacco Use    Smoking status: Never    Smokeless tobacco: Never   Substance and Sexual Activity    Alcohol use: Yes     Comment: Rarely, few times a year    Drug use: Never    Sexual activity: Not Currently     Birth control/protection: IUD (non hormonal)   Other Topics Concern    Do you exercise at least a day, 3 or more days a week? No    Do you follow a special diet? No    Vegan? No    Vegetarian? No    Pescatarian? No    Lactose Free? No    Gluten Free? No    Omnivore? Yes             Physical Exam:   Vitals:    06/13/22 1523   Weight: (!) 278 lb (126.1 kg)   Height: 5' 4'' (1.626 m)    and   Vitals:    06/13/22 1523   Weight: (!) 278 lb (126.1 kg)   Height: 5' 4'' (1.626 m)       Constitutional: Well appearing, no apparent distress.  The patient?s general appearance is well dressed well nourished. The body habitus is normal.    Psychiatric: Patient is alert and oriented to person, time and place, with a pleasant mood and affect.      Eyes: Extraocular movements are intact, pupils are symmetric. No conjunctivitis or icterus is present; eyelids appear normal.     Skin: No lesions or rash noted.     Musculoskeletal and Neurological: Sensation intact to light touch. 2 point discrimination is less than 6mm in all digits unless otherwise noted.  Motor exam is 5/5 throughout bilateral upper extremities.   + Tinel right carpal tunnel.  Tenderness, swelling, and cyst right thumb flexor sheath.     Test Results:  EMG 05/25/2022: My independent review of the nerve conduction study shows there is bilateral  carpal tunnel. These are consistent with the report stated below.   This is an abnormal electrodiagnostic examination.   There is electrodiagnostic evidence suggestive of a right median neuropathy across the wrist, affecting the sensory and motor fibers. There is evidence of both demyelination and axon loss. These findings are consistent with a diagnosis of right carpal tunnel syndrome, and the electrodiagnostic abnormalities are moderate. There is no electrodiagnostic evidence of a right ulnar neuropathy across the wrist or elbow. There is no electrodiagnostic evidence of a right cervical radiculopathy.   Comparative testing to the contralateral limb suggests a mild left carpal syndrome. Correlate clinically.      Radiographs: PREVIOUS 3 views of the right hand which were ordered, obtained and evaluated by me here in the [facility] office today shows no evidence of fracture, arthritis or subluxation.     Impression:  Left carpal tunnel syndrome    Right carpal tunnel syndrome  Right thumb flexor tenosynovitis with cyst, moderate improvement with CSI          Plan: Findings, diagnoses, prognosis, and treatment options were reviewed and discussed with the patient, with all questions answered.     Patient elected to proceed with injection to the right carpal tunnel today. Prior to the procedure, the risks, benefits, alternatives to care, and potential complications, including possible permanent skin hypopigmentation and fat/skin atrophy as well as infection or injury to neurovascular or other critical structures were discussed with patient and informed consent was obtained. Risks associated with corticosteroids and the COVID 19 was discussed.   Should symptoms persist, plan to proceed with a CSI to the left carpal tunnel    Next Appointment:  1 weeks CL  [x]  Reassessment of pain  [x]  No X-Ray  []  X-rays     I recommend a course of treatment as checked below:    []  Splinting/Bracing/Immobilization   []  Aspiration/Injection  []  Fracture Care  []  Decision for Surgery  []  Referral to PT/OT for formal program  []  Imaging study  []  Consultation for electrodiagnostic studies  []  Inflammatory labs  []  Prescription: Meloxicam 7.5mg  orally once daily for 30 days  []  Prescription: Norco 5/325mg  as needed orally once every 6 hours for pain  []  Prescription: Tramadol 50mg  as needed orally every 6 hours for pain  []  Prescription: Voltaren Gel     The patient was examined and evaluated by [name], PA-C for [name], MD.  The evaluation and plan was reviewed and approved by [name], MD.        Hand/Foot/Upper/Lower Extrem Drain/Inject: R carpal tunnel    Date/Time: 06/13/2022 3:30 PM    Performed by: Hart Rochester.  Authorized by: Hart Rochester.    Consent Given by:  Patient  Site marked: the procedure site was marked    Timeout: prior to procedure the correct patient, procedure, and site was verified    Indications:  Pain  Condition: carpal tunnel    Site:  R carpal tunnel  Approach:  Volar  Guidance: ultrasound    Needle Size:  25 G  Medications:  40 mg triamcinolone acetonide 40 mg/mL; 1 mL lidocaine 1%  Patient tolerance:  Patient tolerated the procedure well with no immediate complications

## 2022-06-19 MED ORDER — MOUNJARO 7.5 MG/0.5ML SC SOPN
0 refills
Start: 2022-06-19 — End: ?

## 2022-06-20 ENCOUNTER — Ambulatory Visit: Payer: PRIVATE HEALTH INSURANCE

## 2022-06-20 MED ORDER — MOUNJARO 7.5 MG/0.5ML SC SOPN
0 refills
Start: 2022-06-20 — End: ?

## 2022-06-21 ENCOUNTER — Telehealth: Payer: PRIVATE HEALTH INSURANCE

## 2022-06-21 NOTE — Telephone Encounter
Lvm cb to help in sch consult Hematology   Anyone in office may help pt sch.    Thank you
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
## 2022-06-24 ENCOUNTER — Ambulatory Visit: Payer: BC Managed Care – HMO

## 2022-06-24 ENCOUNTER — Ambulatory Visit: Payer: PRIVATE HEALTH INSURANCE | Attending: Student in an Organized Health Care Education/Training Program

## 2022-06-24 DIAGNOSIS — M659 Synovitis and tenosynovitis, unspecified: Secondary | ICD-10-CM

## 2022-06-24 DIAGNOSIS — G5602 Carpal tunnel syndrome, left upper limb: Secondary | ICD-10-CM

## 2022-06-24 DIAGNOSIS — G5601 Carpal tunnel syndrome, right upper limb: Secondary | ICD-10-CM

## 2022-06-24 MED ADMIN — TRIAMCINOLONE ACETONIDE 40 MG/ML IJ SUSP: INTRA_ARTICULAR | @ 16:00:00 | Stop: 2022-06-24 | NDC 00003029328

## 2022-06-24 MED ADMIN — LIDOCAINE HCL 2 % IJ SOLN: INTRA_ARTICULAR | @ 16:00:00 | Stop: 2022-06-24 | NDC 00409427717

## 2022-06-24 MED ADMIN — SODIUM BICARBONATE 8.4 % IV SOLN (SCOI ONLY): INTRA_ARTICULAR | @ 16:00:00 | Stop: 2022-06-24 | NDC 00409663724

## 2022-06-27 ENCOUNTER — Ambulatory Visit: Payer: PRIVATE HEALTH INSURANCE | Attending: Medical

## 2022-06-27 DIAGNOSIS — M659 Synovitis and tenosynovitis, unspecified: Secondary | ICD-10-CM

## 2022-06-27 DIAGNOSIS — G5602 Carpal tunnel syndrome, left upper limb: Secondary | ICD-10-CM

## 2022-06-27 DIAGNOSIS — G5601 Carpal tunnel syndrome, right upper limb: Secondary | ICD-10-CM

## 2022-06-27 NOTE — Progress Notes
Date:  06/27/2022    Name:  Jamie Cohen  MRN:   [id number]  DOB:   1975-03-31  Age:   47 y.o.      Jamie A.  [name], MD  [name], PA-C    The patient was seen in our office by [name], PA-C for [name], MD.      Chief Complaint: Right hand pain     History: [facility] presents today for evaluation regarding the above chief complaint.Jamie Cohen is a 47 y.o. female who presents with a 2 month history of symptoms after no injury.     The pain is localized in the right hand. She notes swelling and notes numbness. Moreover, the symptoms are exacerbated by use and are alleviated by rest.      Past Medical History and Review of Systems: The patient?s intake sheet, including past medical history, past surgical history, medicines, allergies, social and family history and review of systems was reviewed by myself with the patient today. There are no pertinent changes from the previous visit. Details are noted and documented in the patient?s file.    Past Medical History:   Diagnosis Date    Chicken pox     Headache     Heart murmur     Hyperlipidemia     Hypertension     Sexually transmitted infection     Sleep apnea        Social History     Socioeconomic History    Marital status: Other   Tobacco Use    Smoking status: Never    Smokeless tobacco: Never   Substance and Sexual Activity    Alcohol use: Yes     Comment: Rarely, few times a year    Drug use: Never    Sexual activity: Not Currently     Birth control/protection: IUD (non hormonal)   Other Topics Concern    Do you exercise at least a day, 3 or more days a week? No    Do you follow a special diet? No    Vegan? No    Vegetarian? No    Pescatarian? No    Lactose Free? No    Gluten Free? No    Omnivore? Yes             Physical Exam:   Vitals:    06/27/22 1556   Weight: (!) 278 lb (126.1 kg)   Height: 5' 4'' (1.626 m)    and   Vitals:    06/27/22 1556   Weight: (!) 278 lb (126.1 kg)   Height: 5' 4'' (1.626 m)       Constitutional: Well appearing, no apparent distress.  The patient?s general appearance is well dressed well nourished. The body habitus is normal.    Psychiatric: Patient is alert and oriented to person, time and place, with a pleasant mood and affect.      Eyes: Extraocular movements are intact, pupils are symmetric. No conjunctivitis or icterus is present; eyelids appear normal.     Skin: No lesions or rash noted.     Musculoskeletal and Neurological: Sensation intact to light touch. 2 point discrimination is less than 6mm in all digits unless otherwise noted.  Motor exam is 5/5 throughout bilateral upper extremities.   + Tinel right carpal tunnel.  Tenderness, swelling, and cyst right thumb flexor sheath.     Test Results:  EMG 05/25/2022: My independent review of the nerve conduction study shows there is bilateral  carpal tunnel. These are consistent with the report stated below.   This is an abnormal electrodiagnostic examination.   There is electrodiagnostic evidence suggestive of a right median neuropathy across the wrist, affecting the sensory and motor fibers. There is evidence of both demyelination and axon loss. These findings are consistent with a diagnosis of right carpal tunnel syndrome, and the electrodiagnostic abnormalities are moderate. There is no electrodiagnostic evidence of a right ulnar neuropathy across the wrist or elbow. There is no electrodiagnostic evidence of a right cervical radiculopathy.   Comparative testing to the contralateral limb suggests a mild left carpal syndrome. Correlate clinically.      Radiographs: PREVIOUS 3 views of the right hand which were ordered, obtained and evaluated by me here in the [facility] office today shows no evidence of fracture, arthritis or subluxation.     Impression:  Left carpal tunnel syndrome    Right carpal tunnel syndrome, improved with CSI x 1  Right thumb flexor tenosynovitis with cyst, moderate improvement with CSI          Plan: Findings, diagnoses, prognosis, and treatment options were reviewed and discussed with the patient, with all questions answered.     Patient elected to proceed with injection to the right carpal tunnel today. Prior to the procedure, the risks, benefits, alternatives to care, and potential complications, including possible permanent skin hypopigmentation and fat/skin atrophy as well as infection or injury to neurovascular or other critical structures were discussed with patient and informed consent was obtained. Risks associated with corticosteroids and the COVID 19 was discussed.   Should symptoms persist, plan to proceed with a CSI to the left carpal tunnel  We will submit for a right thumb flexor tendon sheath corticosteroid injection    Next Appointment:  1 weeks CL  [x]  Reassessment of pain  [x]  No X-Ray  []  X-rays     I recommend a course of treatment as checked below:    []  Splinting/Bracing/Immobilization   []  Aspiration/Injection  []  Fracture Care  []  Decision for Surgery  []  Referral to PT/OT for formal program  []  Imaging study  []  Consultation for electrodiagnostic studies  []  Inflammatory labs  []  Prescription: Meloxicam 7.5mg  orally once daily for 30 days  []  Prescription: Norco 5/325mg  as needed orally once every 6 hours for pain  []  Prescription: Tramadol 50mg  as needed orally every 6 hours for pain  []  Prescription: Voltaren Gel     The patient was examined and evaluated by [name], PA-C for [name], MD.  The evaluation and plan was reviewed and approved by [name], MD.        Procedures

## 2022-07-01 ENCOUNTER — Ambulatory Visit: Payer: PRIVATE HEALTH INSURANCE | Attending: Student in an Organized Health Care Education/Training Program

## 2022-07-01 DIAGNOSIS — I050 Rheumatic mitral stenosis: Secondary | ICD-10-CM

## 2022-07-01 DIAGNOSIS — E785 Hyperlipidemia, unspecified: Secondary | ICD-10-CM

## 2022-07-01 DIAGNOSIS — I10 Essential (primary) hypertension: Secondary | ICD-10-CM

## 2022-07-01 DIAGNOSIS — R7303 Prediabetes: Secondary | ICD-10-CM

## 2022-07-01 MED ORDER — METOPROLOL SUCCINATE ER 25 MG PO TB24
25 mg | ORAL_TABLET | Freq: Every day | ORAL | 3 refills | Status: AC
Start: 2022-07-01 — End: ?

## 2022-07-01 NOTE — Patient Instructions
It was good to see you today!    To summarize, we plan to do the following:    - lisinopril 20 mg  - triamterene-HCTZ 37.5-25 mg -> takes half tablet  - START metoprolol 25 mg daily to target HR in 60s at rest  - atorvastatin 40 mg -> repeat lipids and A1C around 07/2022  - echo in 6 months to evaluate MS progression  - low intensity steady state exercise is preferable to high intensity interval training  - Counseled patient on importance of diet and exercise and encouraged adherence to a heart-healthy diet.  - return visit 6 months

## 2022-07-01 NOTE — Progress Notes
[facility]  [address]  [address]  [place], [place] [address]  [phone] Patient:  DOB:  MRN:  Date:  PCP: Khristin Keleher  10-31-1975   [id number]   07-04-22  Lanice Schwab, DO        CARDIOLOGY OUTPATIENT HISTORY AND PHYSICAL EXAMINATION    REQUESTING PHYSICIAN.  Hadj Rowe Robert, DO    REASON FOR CONSULTATION: HTN    HISTORY OF PRESENT ILLNESS:  Jamie Cohen is a 47 y.o. female with a past medical history significant for HTN, HL who presents for comprehensive cardiovascular evaluation.    Just moved to [place] from [place]. History of heart murmur. Hx of HTN and HL. Automated BP cuffs dont work for her.     Denies chest pain, shortness of breath, dyspnea on exertion, orthopnea, paroxysmal noctural dyspnea, leg swelling, palpitations, lightheadedness, near syncope or syncope.    Exercise: fairly sedentary, busy from work.     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 No drugs. Rare alcohol. No children. One pregnancy. - no complications.     Family history: dad had CHF in old age. Mom died of kidney disease.      Jul 04, 2022  Post echo and stress. Exercise induced PACs, no sustained arrhythmias or ischemia. Mod MS. Has had SOB since COVID. Thought it was long COVID. She can walk up and down stairs and feels more winded than she would expect. She is fine on flat ground. She walks around [employer] with no issues.  Feels no irregular heartbeats or palpitations.    Additional ROS negative unless otherwise noted.    MEDICATIONS.   Current Outpatient Medications   Medication Sig    atorvastatin (LIPITOR) 40 mg tablet Take 1 tablet (40 mg total) by mouth daily.    lisinopril 20 mg tablet Take 1 tablet (20 mg total) by mouth daily.    mirabegron (MYRBETRIQ) 25 mg ER tablet Take 1 tablet (25 mg total) by mouth daily.    sodium sulfate-potassium sulfate-magnesium sulfate solution At 6pm the evening before colonoscopy, drink 16oz x1 dose, then drink 32oz water over 1 hour. At 5am the day of colonoscopy, repeat above.    tirzepatide (MOUNJARO) 7.5 mg/0.5 mL injection Inject 0.5 mLs (7.5 mg total) under the skin once a week.    triamterene-hydroCHLOROthiazide 37.5-25 mg capsule Take 1 capsule by mouth daily.    valACYclovir 1000 mg tablet      No current facility-administered medications for this visit.      ALLERGIES.  Allergies   Allergen Reactions    Atorvastatin Diarrhea and Nausea And Vomiting     Patient states tolerates BRAND only    Oxycodone      PAST MEDICAL HISTORY.  Past Medical History:   Diagnosis Date    Chicken pox     Headache     Heart murmur     Hyperlipidemia     Hypertension     Sexually transmitted infection     Sleep apnea      PAST SURGICAL HISTORY.  Past Surgical History:   Procedure Laterality Date    BREAST SURGERY       FAMILY HISTORY.   Family History   Problem Relation Age of Onset    Hypothyroidism Mother     Kidney disease Father     Hypothyroidism Sister      SOCIAL HISTORY.   Social History     Socioeconomic History    Marital status: Other  Tobacco Use    Smoking status: Never    Smokeless tobacco: Never   Substance and Sexual Activity    Alcohol use: Yes     Comment: Rarely, few times a year    Drug use: Never    Sexual activity: Not Currently     Birth control/protection: IUD (non hormonal)   Other Topics Concern    Do you exercise at least a day, 3 or more days a week? No    Do you follow a special diet? No    Vegan? No    Vegetarian? No    Pescatarian? No    Lactose Free? No    Gluten Free? No    Omnivore? Yes       PHYSICAL EXAMINATION.    Vitals Current      Temp           BP     131/88       HR    68      RR           Sats           Weight    (!) 278 lb 3.2 oz (126.2 kg)  Body mass index is 47.75 kg/m?Marland Kitchen     System Check if normal Positive or additional negative findings   Constit  [x]  General appearance     Eyes  []  Conj/Lids []  Pupils  []  Fundi     HEENT  [x]  External ears/nose []  Otoscopy   [x]  Gross Hearing []  Nasal mucosa   []  Lips/teeth/gums []  Oropharynx    []  mucus membranes [x]  Head     Neck  []  Inspection/palpation []  Thyroid     Resp  [x]  Effort []  Wheezing    [x]  Auscultation  []  Crackles     CV  [x]  Rhythm/rate   []  Murmurs   []  Lower extremity edema   [x]  JVP non-elevated    [x]  Carotid  auscultation normal   Normal pulses:   [x]  Radial []  Femoral  []  Pedal   Diastolic murmur    GI  []  abd masses    []  tenderness   []  rebound/guarding   []  Liver/spleen []  Rectal     Neuro  []  CN2-12 intact grossly   [x]  Alert and oriented   []  DTR      []  Muscle strength      []  Sensation   []  Gait/balance     Psych  [x]  Insight/judgement     []  Mood/affect    [x]  Gross cognition          LABORATORY STUDIES.   Reviewed during this encounter    Complete Blood Count Metabolic Panel Lipid Analysis   Lab Results   Component Value Date    WBC 8.32 03/30/2022    HGB 13.8 03/30/2022    HCT 43.1 03/30/2022    PLT 499 (H) 03/30/2022    Lab Results   Component Value Date    NA 137 03/30/2022    K 4.7 03/30/2022    BUN 14 03/30/2022    CREAT 0.70 03/30/2022    GLUCOSE 124 (H) 03/30/2022    AST 26 03/30/2022    ALT 19 03/30/2022    ALKPHOS 83 03/30/2022    BILITOT 0.4 03/30/2022    Lab Results   Component Value Date    CHOL 193 03/30/2022    CHOLDLCAL 126 (H) 03/30/2022    CHOLHDL 44 (L) 03/30/2022    NOHDLCHOCAL 149 (H)  03/30/2022    TRIGLY 114 03/30/2022      No results found for: ''TROPONIN'', ''BNP''  Lab Results   Component Value Date    HGBA1C 6.4 (H) 03/30/2022       DIAGNOSTIC STUDIES.    TTE Reviewed by me  Echo adult transthoracic complete    Result Date: 06/27/2022   1. Technically difficult study.   2. Normal left ventricular size.   3. Mild septal left ventricular hypertrophy.   4. The calculated ejection fraction (Simpson's) is 62 %.   5. 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 The mean diastolic gradient across the mitral valve is 8.0 mmHg at a heart rate of 80 bpm. There is associated mild mitral regurgitation   6. Abnormal LV diastolic function.   7. Normal PA systolic pressure. Normal left atrial size   8. There are no prior studies on this patient for comparison purposes.  [id number] Roseanne Reno MD  Electronically signed by [id number] Roseanne Reno MD on 06/27/2022 at 9:57:44 AM     Sonographer: Jamse Belfast      Final         ACC/AHA Cholesterol Management Guideline Recommendations:   Continuation of statin treatment, as indicated based on pre-treatment risk assessment.      ASCVD Risk Score    10-year ASCVD risk  is 3.76% as of 2:07 PM on 07/01/2022.   10-year ASCVD risk with optimal risk factors  is 0.5%.   Values used to calculate ASCVD Risk Score    Age  27 y.o.     Gender  female   Race  African American   HDL Cholesterol  44 mg/dL. (measured on 03/30/2022)   LDL Cholesterol  126 mg/dL. (measured on 03/30/2022)   Total Cholesterol  193 mg/dL. (measured on 03/30/2022)   Systolic Blood Pressure  131 mm Hg. (measured on 07/01/2022)   Blood Pressure Medication Present  Yes   Smoking Status  currently not a smoker   Diabetes Present  No     Click here for the [facility] ASCVD Cardiovascular Risk Estimator Plus tool Office manager).    Echo 05/2022  CONCLUSIONS   1. Technically difficult study.   2. Normal left ventricular size.   3. Mild septal left ventricular hypertrophy.   4. The calculated ejection fraction (Simpson's) is 62 %.   5. 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 The mean diastolic gradient across the mitral valve is 8.0 mmHg at a heart rate of 80 bpm. There is associated mild mitral regurgitation   6. Abnormal LV diastolic function.   7. Normal PA systolic pressure. Normal left atrial size   8. There are no prior studies on this patient for comparison purposes.     Stress echo 04/2022  SUMMARY:   1. Fair exercise tolerance. The patient exercised for 6 min and 32 sec to stage III, achieving 8.0 METs.   2. Blood pressure response to stress was hypertensive (240/82 mmHg).   3. Heart rate response to stress was adequate (>85% MPHR).   4. Stress test was adequate for inducing target heart rate and/or exercise response.   5. Moderate PACs observed during recovery. Patient asymptomatic.   6. Patient experienced shortness of breath during peak exercise. No chest pain reported.   7. Patient reported hypertensive blood pressure readings on the left arm. Last blood pressure recorded was obtained from the right arm.   8. ECG findings are not suggestive of ischemia.   9. Echocardiogram is not suggestive of ischemia to  the extent achieved.     ECG 04/28/22 Reviewed by me: Normal sinus rhythm 81 bpm,       IMPRESSION  Mercedies Ganesh is a 47 y.o. female with:  Rheumatic mitral stenosis  Exercise induced PACs  HTN  HL - LDL 126 without statin  Prediabetes  Morbid obesity    RECOMMENDATIONS/PLAN  - lisinopril 20 mg  - triamterene-HCTZ 37.5-25 mg -> takes half tablet  - START metoprolol 25 mg daily to target HR in 60s at rest  - atorvastatin 40 mg -> repeat lipids and A1C around 07/2022  - echo in 6 months to evaluate MS progression  - low intensity steady state exercise is preferable to high intensity interval training  - Counseled patient on importance of diet and exercise and encouraged adherence to a heart-healthy diet.  - return visit 6 months    The above recommendations were discussed in detail with the patient.  All questions were answered satisfactorily and the patient is in full agreement with this recommended plan of care.    Thank you, Lanice Schwab, DO for involving me in your patient's care.  Please feel free to contact me with any questions or concerns.    AUTHOR:   Roseanne Reno, MD  Clinical Instructor   [facility] Interventional Cardiology  [address]  [address]  [place], [place] [address]  [phone]    07/01/2022 2:07 PM    Complex medical decision making

## 2022-07-07 ENCOUNTER — Telehealth: Payer: PRIVATE HEALTH INSURANCE | Attending: Student in an Organized Health Care Education/Training Program

## 2022-07-07 ENCOUNTER — Ambulatory Visit: Payer: PRIVATE HEALTH INSURANCE

## 2022-07-07 ENCOUNTER — Telehealth: Payer: PRIVATE HEALTH INSURANCE

## 2022-07-07 DIAGNOSIS — E119 Type 2 diabetes mellitus without complications: Secondary | ICD-10-CM

## 2022-07-07 DIAGNOSIS — Z6841 Body Mass Index (BMI) 40.0 and over, adult: Secondary | ICD-10-CM

## 2022-07-07 MED ORDER — MOUNJARO 10 MG/0.5ML SC SOPN
10 mg | SUBCUTANEOUS | 3 refills | Status: AC
Start: 2022-07-07 — End: ?

## 2022-07-07 NOTE — Telephone Encounter
Call Back Request      Reason for call back:   Patient is returning the call she stated the name of the caller was ''Sharilyn Sites'' but could be wrong, per teams chat transfer to nurse ext [phone] however there was no answer    Appointment Related?  [x]  Yes  []  No     If yes;  Date: 5/9  Time: 12pm  Any Symptoms:  []  Yes  [x]  No      If yes, what symptoms are you experiencing:    Duration of symptoms (how long):    Have you taken medication for symptoms (OTC or Rx):      If call was taken outside of clinic hours:    [] Patient or caller has been notified that this message was sent outside of normal clinic hours.     [] Patient or caller has been warm transferred to the physician's answering service. If applicable, patient or caller informed to please call us back if symptoms progress.  Patient or caller has been notified of the turnaround time of 1-2 business day(s).

## 2022-07-07 NOTE — Telephone Encounter
Called patient back for Video visit intake prior to appointment.      Jamie Cohen [name] 07/07/2022 11:33 AM

## 2022-07-07 NOTE — Progress Notes
Patient Consent to Telehealth   The patient agreed to participate in the video visit prior to joining the visit.      [facility]  Outpatient Clinic Note    PATIENT:  Jamie Cohen  MRN:  [id number]  DOB:  [birth year]  DATE OF SERVICE:  07/07/2022    PRIMARY CARE PROVIDER: Lanice Schwab, DO    Cc:    Chief Complaint   Patient presents with    Follow-up     medications     Subjective:     Jamie Cohen is a a 47 y.o. female who presents with   Chief Complaint   Patient presents with    Follow-up     medications     > HgbA1C at goal <7% on Mounjaro ( tolerated 7.5 mg)   7.5 mg weekly with plan to titrate dose monthly as tolerated   Historically did not respond to Metformin  Continue Lisinopril 20 mg daily and Statin therapy  PCV 20 at next visit      Follow-up: 1 month Mounjaro titration, 3 months weight check, A1C and  PCV 20 ( annual physical)       Patient Active Problem List    Diagnosis Date Noted    Calcific tendonitis of left shoulder 04/25/2022    Subscapular bursitis 04/25/2022         Allergies   Allergen Reactions    Atorvastatin Diarrhea and Nausea And Vomiting     Patient states tolerates BRAND only    Oxycodone        Medications that the patient states to be currently taking   Medication Sig    atorvastatin (LIPITOR) 40 mg tablet Take 1 tablet (40 mg total) by mouth daily.    lisinopril 20 mg tablet Take 1 tablet (20 mg total) by mouth daily.    metoprolol succinate 25 mg 24 hr tablet Take 1 tablet (25 mg total) by mouth daily.    mirabegron (MYRBETRIQ) 25 mg ER tablet Take 1 tablet (25 mg total) by mouth daily.    tirzepatide (MOUNJARO) 7.5 mg/0.5 mL injection Inject 0.5 mLs (7.5 mg total) under the skin once a week.    triamterene-hydroCHLOROthiazide 37.5-25 mg capsule Take 1 capsule by mouth daily.    valACYclovir 1000 mg tablet          Past Medical History:   Diagnosis Date    Chicken pox     Headache     Heart murmur     Hyperlipidemia     Hypertension     Sexually transmitted infection Sleep apnea        Past Surgical History:   Procedure Laterality Date    BREAST SURGERY         Family History   Problem Relation Age of Onset    Hypothyroidism Mother     Kidney disease Father     Hypothyroidism Sister          Immunization History   Administered Date(s) Administered    COVID-19, mRNA, (Moderna) 100 mcg/0.5 mL 06/20/2019, 07/18/2019         Health Maintenance   Topic Date Due    Tdap/Td Vaccine (1 - Tdap) Never done    Cervical Ca Screening: HPV Testing  Never done    Cervical Ca Screening: PAP Smear  Never done    Preventive Wellness Visit  Never done    Colorectal Cancer Screening  Never done    COVID-19 Vaccine(Tracks primary and booster  doses, not sup/immunocomp) (3 - 2023-24 season) 10/29/2021    Influenza Vaccine (Season Ended) 10/30/2022    Breast Ca Screening: MAMMOGRAM  04/30/2023    Prediabetes Screening (See hover text)  03/30/2025    Hepatitis B Screening  Completed    Hepatitis C Screening  Completed    HIV Screening  Completed         Review of Systems:   ROS    Objective:  There were no vitals taken for this visit.  Physical Exam        LABS:  Lab Results   Component Value Date    WBC 8.32 03/30/2022    HGB 13.8 03/30/2022    HCT 43.1 03/30/2022    MCV 87.4 03/30/2022    PLT 499 (H) 03/30/2022     Lab Results   Component Value Date    CREAT 0.70 03/30/2022    BUN 14 03/30/2022    NA 137 03/30/2022    K 4.7 03/30/2022    CL 101 03/30/2022    CO2 25 03/30/2022     Lab Results   Component Value Date    ALT 19 03/30/2022    AST 26 03/30/2022    ALKPHOS 83 03/30/2022    BILITOT 0.4 03/30/2022     Lab Results   Component Value Date    CHOL 193 03/30/2022    CHOLDLCAL 126 (H) 03/30/2022    CHOLHDL 44 (L) 03/30/2022    TRIGLY 114 03/30/2022     Lab Results   Component Value Date    TSH 2.0 03/30/2022     Lab Results   Component Value Date    HGBA1C 6.4 (H) 03/30/2022              Assessment & Plan:   Diagnoses and all orders for this visit:    Type 2 diabetes mellitus treated without insulin (HCC/RAF)  -     tirzepatide (MOUNJARO) 10 mg/0.5 mL injection; Inject 0.5 mLs (10 mg total) under the skin once a week.    HgbA1V 6.3% at goal <7%  > HgbA1C at goal <7% Increase Mounjaro to 10 mg weekly  Historically did not respond to Metformin, prefers not to take it  Continue Lisinopril 20 mg daily and Statin therapy  PCV 20 at next visit      Follow-up: 1 month Mounjaro titration, 3 months weight check, A1C and  PCV 20           Level of medical decision making based on the evaluation today:  Problem(s) addressed   []  minor/self-limited problem  []  acute uncomplicated illness/injury, or 1 stable chronic illness, or 2 or more self-limited/minor problems  [x]  acute systemic illness or complicated injury or 1 new undiagnosed problem with uncertain prognosis or > 2 stable chronic illness or >1 chronic illness with exacerbation/progression/se of tx  []  acute or chronic illness/injury that poses threat to life or bodily function or > 1 chronic illness with severe exacerbation/progression/or  se of tx     Risk of complication   []  Minimal  []  Low  [x]  Moderate  []  High              Signed,  Lanice Schwab, DO   Assistant Professor, [employer] at [employer]  Division of Internal Medicine    [facility] [address]  [place], [place] [address]  Tel: [phone]  Fx: [phone]    07/07/2022 12:11 PM

## 2022-07-11 ENCOUNTER — Ambulatory Visit: Payer: PRIVATE HEALTH INSURANCE

## 2022-07-22 ENCOUNTER — Ambulatory Visit: Payer: PRIVATE HEALTH INSURANCE

## 2022-07-26 ENCOUNTER — Telehealth: Payer: PRIVATE HEALTH INSURANCE

## 2022-07-26 ENCOUNTER — Ambulatory Visit: Payer: PRIVATE HEALTH INSURANCE

## 2022-07-26 NOTE — Telephone Encounter
Diagnosis-  Thrombocytosis  [] - Newly Diagnosed    [] - Second opinion  [x] - Continuation of care    ? Type of insurance/Is auth needed? If so, status of auth?   [employer] HMO (authorized)     ? Date/Time of when appt was scheduled  06/11 @ 9:00am with Dr. Ignacia Palma     ? Referring MD/ph. Number:   Dr. Denton Lank [phone]     ? Status of medical records  [x] - [facility]  [] - Epic care everywhere   [] - Outside facility/ office/ Hospital- Patient has been provided our fax number and medical records will be faxed    ? Where new patient paperwork is supposed to be sent  [x] - Office   [] - Mailed to home     ? Any other pertinent information:

## 2022-07-27 DIAGNOSIS — E1165 Type 2 diabetes mellitus with hyperglycemia: Secondary | ICD-10-CM

## 2022-07-27 MED ORDER — TIRZEPATIDE 7.5 MG/0.5ML SC SOPN
7.5 mg | SUBCUTANEOUS | 0 refills | Status: AC
Start: 2022-07-27 — End: ?

## 2022-07-30 ENCOUNTER — Inpatient Hospital Stay: Payer: PRIVATE HEALTH INSURANCE | Attending: Student in an Organized Health Care Education/Training Program

## 2022-07-30 DIAGNOSIS — R102 Pelvic and perineal pain: Secondary | ICD-10-CM

## 2022-08-02 ENCOUNTER — Ambulatory Visit: Payer: PRIVATE HEALTH INSURANCE | Attending: Student in an Organized Health Care Education/Training Program

## 2022-08-05 ENCOUNTER — Ambulatory Visit: Payer: PRIVATE HEALTH INSURANCE

## 2022-08-05 ENCOUNTER — Ambulatory Visit: Payer: PRIVATE HEALTH INSURANCE | Attending: Student in an Organized Health Care Education/Training Program

## 2022-08-08 ENCOUNTER — Ambulatory Visit: Payer: PRIVATE HEALTH INSURANCE | Attending: Medical

## 2022-08-09 ENCOUNTER — Ambulatory Visit: Payer: BC Managed Care – HMO | Attending: Hematology & Oncology

## 2022-08-09 DIAGNOSIS — D75839 Thrombocytosis, unspecified: Secondary | ICD-10-CM

## 2022-08-09 NOTE — Interdisciplinary
Patient presented to clinic for blood draw.      CBC, CMP, ESR, CRP, Cortisol, Antinuclear Ab, PEP & Total Protein, CALR, BCR-ABL1 & JAK2 V617 Mutation Quantitation sent to [facility] lab for processing.    Peripheral vein access: 21 gauge butterfly started to left antecubital vein on first attempt with good blood return.

## 2022-08-09 NOTE — Consults
TITLE:  HEMATOLOGY CONSULTATION    DATE OF SERVICE:  08/09/2022    REFERRING PHYSICIAN:  Jerrilyn Cairo, DO [phone])     She is a 47 year old woman referred by Dr. Lanice Schwab because of elevated platelet count.     In reviewing her lab results going back to 2018, her platelet count has been consistently elevated between 400,000 and 500,000. The highest has been 499,000, but it has never exceeded 500,000 and it has otherwise fluctuated.     Other than the elevated platelet count, her blood count has been normal.     She indicates that she was told about an elevated platelet count back in 2013, but nothing was done in terms of any evaluation or explanation.     Overall, she has been healthy without any major problems associated with her blood. She does have comorbidities, as will be discussed, but she has never had any problems with bleeding or thrombosis. She denies having febrile episodes or infections of any kind or having been diagnosed with any kind of febrile episode. She has no history of inflammatory diagnoses or problems. She is not on any medication that would be expected to produce thrombocytosis.     She is para 1, gravida 1 and has normal menstrual periods. Has never smoked. Has been told of having a fatty liver.     She has an IUD but has had no complications or problems.     She has no family history of any kind of blood disease. An aunt had bone cancer, but no other cancers in the family.     Comorbidities are hyperlipidemia, hypertension, and sleep apnea.     Medications are atorvastatin 40 mg, lisinopril 20 mg, metoprolol 25 mg, tirzepatide 10 mg subcutaneous weekly, triamterene/hydrochlorothiazide 37.5/25, and valacyclovir 1000 mg as needed.     On examination, she is obese. BMI is 47.74, which puts her in the morbid obesity category, but vital signs are all normal.     There are no abnormal findings on physical examination. Her abdomen is soft, and I do not feel her liver or spleen, though with the obesity the examination is suboptimal.    DISCUSSION:  She has a mild thrombocytosis always between 400,000 and 500,000 but without any specific trend and no obvious underlying cause. There is nothing to suggest an occult infection, inflammatory disorder or neoplasm, though of course these cannot be totally excluded at this point.     Other than that, none of her underlying diagnoses or medications should affect her platelet count.     There is no splenomegaly at least on physical exam, but again that is not definitive since the obesity makes it difficult to do a really good examination.     I cannot rule out the possibility of a primary bone marrow disorder, that is a myeloproliferative neoplasm, and I will check for that as well as any indication of inflammation or underlying disease that could affect the platelet count.     The platelet count itself is not a risk and is not going to cause any difficulties and does not have to be corrected. It is just a matter of trying to determine if there is some underlying problem to explain it. I will have her return to discuss the results.      Boykin Nearing, MD [phone])        SD/MODL CONF#: [id number]  D: 08/09/2022 10:12:29 T: 08/09/2022 10:35:49 DOCUMENT: [id number]
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
## 2022-08-10 LAB — Total Protein, Serum (PEP): TOTAL PROTEIN, SERUM: 7.5 g/dL (ref 6.1–8.2)

## 2022-08-10 LAB — Comprehensive Metabolic Panel
ESTIMATED GFR 2021 CKD-EPI: >89 mL/min/{1.73_m2} (ref 8–70)
TOTAL CO2: 24 mmol/L (ref 20–30)

## 2022-08-10 LAB — CBC: RED CELL DISTRIBUTION WIDTH-SD: 47.6 fL (ref 36.9–48.3)

## 2022-08-10 LAB — PEP, Serum: GAMMA GLOBULINS %: 16.6 (ref 11.1–18.8)

## 2022-08-10 LAB — Cortisol: CORTISOL: 6 ug/dL

## 2022-08-10 LAB — Sedimentation Rate, Erythrocyte: SEDIMENTATION RATE, ERYTHROCYTE: 74 mm/h — ABNORMAL HIGH (ref <=25)

## 2022-08-10 LAB — Differential Automated: BASOPHIL PERCENT, AUTO: 0.7 (ref 1.80–6.90)

## 2022-08-10 LAB — C-Reactive Protein: C-REACTIVE PROTEIN: 0.6 mg/dL (ref <0.8)

## 2022-08-11 ENCOUNTER — Ambulatory Visit: Payer: BC Managed Care – HMO

## 2022-08-11 LAB — Antinuclear Ab: ANTINUCLEAR AB: POSITIVE {titer}

## 2022-08-11 LAB — Antinuclear Ab Titer

## 2022-08-15 LAB — FISH: LAB AP SIGNATURES: NORMAL

## 2022-08-16 LAB — JAK2 Mutation-Quantitation

## 2022-08-16 LAB — FISH

## 2022-08-16 LAB — CALR (Calreticulin) Mutation Analysis
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
## 2022-08-18 MED ORDER — LISINOPRIL 20 MG PO TABS
20 mg | ORAL_TABLET | Freq: Every day | ORAL | 11 refills
Start: 2022-08-18 — End: ?

## 2022-08-19 DIAGNOSIS — M7532 Calcific tendinitis of left shoulder: Secondary | ICD-10-CM

## 2022-08-19 MED ORDER — MOUNJARO 7.5 MG/0.5ML SC SOPN
0 refills
Start: 2022-08-19 — End: ?
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
## 2022-08-22 ENCOUNTER — Ambulatory Visit: Payer: PRIVATE HEALTH INSURANCE

## 2022-08-22 MED ORDER — MOUNJARO 7.5 MG/0.5ML SC SOPN
7.5 mg | SUBCUTANEOUS
Start: 2022-08-22 — End: ?

## 2022-08-22 MED ORDER — LISINOPRIL 20 MG PO TABS
20 mg | ORAL_TABLET | Freq: Every day | ORAL | 11 refills
Start: 2022-08-22 — End: ?

## 2022-08-26 ENCOUNTER — Ambulatory Visit: Payer: PRIVATE HEALTH INSURANCE

## 2022-08-26 DIAGNOSIS — E1165 Type 2 diabetes mellitus with hyperglycemia: Secondary | ICD-10-CM

## 2022-08-26 MED ORDER — TIRZEPATIDE 7.5 MG/0.5ML SC SOPN
7.5 mg | SUBCUTANEOUS | 0 refills | Status: AC
Start: 2022-08-26 — End: ?

## 2022-08-26 MED ORDER — LISINOPRIL 20 MG PO TABS
20 mg | ORAL_TABLET | Freq: Every day | ORAL | 3 refills | 30.00000 days
Start: 2022-08-26 — End: ?

## 2022-08-27 MED ORDER — LISINOPRIL 20 MG PO TABS
20 mg | ORAL_TABLET | Freq: Every day | ORAL | 2 refills | Status: AC
Start: 2022-08-27 — End: ?

## 2022-08-31 ENCOUNTER — Ambulatory Visit: Payer: PRIVATE HEALTH INSURANCE | Attending: Student in an Organized Health Care Education/Training Program

## 2022-10-05 ENCOUNTER — Ambulatory Visit: Payer: PRIVATE HEALTH INSURANCE

## 2022-10-12 ENCOUNTER — Ambulatory Visit: Payer: BC Managed Care – HMO

## 2022-10-12 DIAGNOSIS — Z1211 Encounter for screening for malignant neoplasm of colon: Secondary | ICD-10-CM

## 2023-01-02 ENCOUNTER — Ambulatory Visit: Payer: PRIVATE HEALTH INSURANCE | Attending: Student in an Organized Health Care Education/Training Program

## 2023-01-02 DIAGNOSIS — I050 Rheumatic mitral stenosis: Secondary | ICD-10-CM

## 2023-01-02 DIAGNOSIS — I10 Essential (primary) hypertension: Secondary | ICD-10-CM

## 2023-01-02 DIAGNOSIS — R7303 Prediabetes: Secondary | ICD-10-CM

## 2023-01-02 DIAGNOSIS — R002 Palpitations: Secondary | ICD-10-CM

## 2023-01-02 DIAGNOSIS — E785 Hyperlipidemia, unspecified: Secondary | ICD-10-CM

## 2023-01-02 MED ORDER — METOPROLOL SUCCINATE ER 25 MG PO TB24
25 mg | ORAL_TABLET | Freq: Two times a day (BID) | ORAL | 3 refills | Status: AC
Start: 2023-01-02 — End: ?

## 2023-01-02 NOTE — Patient Instructions
It was good to see you today!    To summarize, we plan to do the following:    - lisinopril 20 mg  - triamterene-HCTZ 37.5-25 mg -> takes half tablet  - metoprolol succinate 25 mg daily to twice a day to target HR in 60s at rest  - atorvastatin 40 mg -> due for repeat labs  - echo in 6 months to evaluate MS progression  - low intensity steady state exercise is preferable to high intensity interval training - stay below 70-75% max predicted HR range  - Counseled patient on importance of diet and exercise and encouraged adherence to a heart-healthy diet.  - return visit 6 months for symptom assessment, will do echo annually or sooner if exertional symptoms develop

## 2023-01-02 NOTE — Progress Notes
[facility]  [address]  [address]  [place], [place] [address]  [phone] Patient:  DOB:  MRN:  Date:  PCP: Jamie Cohen  [birth year]   [id number]   01/02/2023  Jamie Schwab, DO        CARDIOLOGY OUTPATIENT HISTORY AND PHYSICAL EXAMINATION    REQUESTING PHYSICIAN.  Jamie Cohen, *    REASON FOR CONSULTATION: HTN    HISTORY OF PRESENT ILLNESS:  Jamie Cohen is a 47 y.o. female with a past medical history significant for HTN, HL who presents for comprehensive cardiovascular evaluation.    Just moved to [place] from [place]. History of heart murmur. Hx of HTN and HL. Automated BP cuffs dont work for her.     Denies chest pain, shortness of breath, dyspnea on exertion, orthopnea, paroxysmal noctural dyspnea, leg swelling, palpitations, lightheadedness, near syncope or syncope.    Exercise: fairly sedentary, busy from work.     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 No drugs. Rare alcohol. No children. One pregnancy. - no complications.     Family history: dad had CHF in old age. Mom died of kidney disease.      07/17/22  Post echo and stress. Exercise induced PACs, no sustained arrhythmias or ischemia. Mod MS. Has had SOB since COVID. Thought it was long COVID. She can walk up and down stairs and feels more winded than she would expect. She is fine on flat ground. She walks around Jamie Cohen with no issues.  Feels no irregular heartbeats or palpitations.    01/02/23  Overall better on metoprolol. Less tired sluggish, more walking ability. Interested in running. She was in the [employer].    Additional ROS negative unless otherwise noted.    MEDICATIONS.   Current Outpatient Medications   Medication Sig    atorvastatin (LIPITOR) 40 mg tablet Take 1 tablet (40 mg total) by mouth daily.    lisinopril 20 mg tablet Take 1 tablet (20 mg total) by mouth daily.    metoprolol succinate 25 mg 24 hr tablet Take 1 tablet (25 mg total) by mouth daily.    mirabegron (MYRBETRIQ) 25 mg ER tablet Take 1 tablet (25 mg total) by mouth daily.    tirzepatide (MOUNJARO) 10 mg/0.5 mL injection Inject 0.5 mLs (10 mg total) under the skin once a week.    triamterene-hydroCHLOROthiazide 37.5-25 mg capsule Take 1 capsule by mouth daily.    sodium sulfate-potassium sulfate-magnesium sulfate solution At 6pm the evening before colonoscopy, drink 16oz x1 dose, then drink 32oz water over 1 hour. At 5am the day of colonoscopy, repeat above. (Patient not taking: Reported on 07/07/2022)    tirzepatide (MOUNJARO) 7.5 mg/0.5 mL SC injection Inject 0.5 mLs (7.5 mg total) under the skin once a week.    valACYclovir 1000 mg tablet  (Patient not taking: Reported on 01/02/2023)     No current facility-administered medications for this visit.      ALLERGIES.  Allergies   Allergen Reactions    Atorvastatin Diarrhea and Nausea And Vomiting     Patient states tolerates BRAND only    Oxycodone      PAST MEDICAL HISTORY.  Past Medical History:   Diagnosis Date    Chicken pox     Headache     Heart murmur     Hyperlipidemia     Hypertension     Sexually transmitted infection     Sleep apnea      PAST SURGICAL HISTORY.  Past Surgical History:   Procedure Laterality Date    BREAST SURGERY       FAMILY HISTORY.   Family History   Problem Relation Age of Onset    Hypothyroidism Mother     Kidney disease Father     Hypothyroidism Sister      SOCIAL HISTORY.   Social History     Socioeconomic History    Marital status: Other   Tobacco Use    Smoking status: Never    Smokeless tobacco: Never   Substance and Sexual Activity    Alcohol use: Yes     Comment: Rarely, few times a year    Drug use: Never    Sexual activity: Not Currently     Birth control/protection: IUD (non hormonal)   Other Topics Concern    Do you exercise at least a day, 3 or more days a week? No    Do you follow a special diet? No    Vegan? No    Vegetarian? No    Pescatarian? No    Lactose Free? No    Gluten Free? No    Omnivore? Yes PHYSICAL EXAMINATION.    Vitals Current      Temp           BP     116/74       HR    72      RR    16      Sats    96 %      Weight    (!) 267 lb (121.1 kg)  Body mass index is 45.83 kg/m?Marland Kitchen     System Check if normal Positive or additional negative findings   Constit  [x]  General appearance     Eyes  []  Conj/Lids []  Pupils  []  Fundi     HEENT  [x]  External ears/nose []  Otoscopy   [x]  Gross Hearing []  Nasal mucosa   []  Lips/teeth/gums []  Oropharynx    []  mucus membranes [x]  Head     Neck  []  Inspection/palpation []  Thyroid     Resp  [x]  Effort []  Wheezing    [x]  Auscultation  []  Crackles     CV  [x]  Rhythm/rate   []  Murmurs   []  Lower extremity edema   [x]  JVP non-elevated    [x]  Carotid  auscultation normal   Normal pulses:   [x]  Radial []  Femoral  []  Pedal   Diastolic murmur    GI  []  abd masses    []  tenderness   []  rebound/guarding   []  Liver/spleen []  Rectal     Neuro  []  CN2-12 intact grossly   [x]  Alert and oriented   []  DTR      []  Muscle strength      []  Sensation   []  Gait/balance     Psych  [x]  Insight/judgement     []  Mood/affect    [x]  Gross cognition          LABORATORY STUDIES.   Reviewed during this encounter    Complete Blood Count Metabolic Panel Lipid Analysis   Lab Results   Component Value Date    WBC 7.59 08/09/2022    HGB 13.6 08/09/2022    HCT 42.4 08/09/2022    PLT 537 (H) 08/09/2022    Lab Results   Component Value Date    NA 137 08/09/2022    K 4.2 08/09/2022    BUN 18 08/09/2022    CREAT 0.74 08/09/2022  GLUCOSE 97 08/09/2022    AST 26 08/09/2022    ALT 30 08/09/2022    ALKPHOS 86 08/09/2022    BILITOT 0.2 08/09/2022    Lab Results   Component Value Date    CHOL 193 03/30/2022    CHOLDLCAL 126 (H) 03/30/2022    CHOLHDL 44 (L) 03/30/2022    NOHDLCHOCAL 149 (H) 03/30/2022    TRIGLY 114 03/30/2022      No results found for: ''TROPONIN'', ''BNP''  Lab Results   Component Value Date    HGBA1C 6.5 (H) 08/02/2022       DIAGNOSTIC STUDIES.    TTE Reviewed by me  Echo adult transthoracic complete    Result Date: 12/28/2022   1. Normal left ventricular size.   2. Mild concentric left ventricular hypertrophy.   3. Left ventricular ejection fraction is approximately 65 to 70%.   4. Diastolic function is indeterminant due to mitral valve pathology.   5. 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 Mean gradient across mitral valve is 8.0 mmHg at a heart rate of 78 bpm.   6. Mild mitral valve regurgitation.   7. Upper normal left atrium in size.   8. Compared to prior study on 06/24/22, there are no significant changes.  [id number] Jamie Reno MD  Electronically signed by [id number] Jamie Reno MD on 12/28/2022 at 4:43:22 PM     Sonographer: Nonah Mattes      Final       Echo adult transthoracic complete    Result Date: 06/27/2022   1. Technically difficult study.   2. Normal left ventricular size.   3. Mild septal left ventricular hypertrophy.   4. The calculated ejection fraction (Simpson's) is 62 %.   5. 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 The mean diastolic gradient across the mitral valve is 8.0 mmHg at a heart rate of 80 bpm. There is associated mild mitral regurgitation   6. Abnormal LV diastolic function.   7. Normal PA systolic pressure. Normal left atrial size   8. There are no prior studies on this patient for comparison purposes.  [id number] Jamie Reno MD  Electronically signed by [id number] Jamie Reno MD on 06/27/2022 at 9:57:44 AM     Sonographer: Jamse Belfast      Final         ACC/AHA Cholesterol Management Guideline Recommendations:   Continuation of statin treatment, as indicated based on pre-treatment risk assessment.      ASCVD Risk Score    10-year ASCVD risk  is 2.28% as of 9:35 AM on 01/02/2023.   10-year ASCVD risk with optimal risk factors  is 0.6%.   Values used to calculate ASCVD Risk Score    Age  47 y.o.     Gender  female   Race  African American   HDL Cholesterol  44 mg/dL. (measured on 03/30/2022)   LDL Cholesterol  126 mg/dL. (measured on 03/30/2022)   Total Cholesterol  193 mg/dL. (measured on 03/30/2022)   Systolic Blood Pressure  116 mm Hg. (measured on 01/02/2023)   Blood Pressure Medication Present  Yes   Smoking Status  currently not a smoker   Diabetes Present  No     Click here for the [facility] ASCVD Cardiovascular Risk Estimator Plus tool Office manager).    Echo 05/2022  CONCLUSIONS   1. Technically difficult study.   2. Normal left ventricular size.   3. Mild septal left ventricular hypertrophy.   4. The calculated ejection fraction (Simpson's) is  62 %.   5. 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 The mean diastolic gradient across the mitral valve is 8.0 mmHg at a heart rate of 80 bpm. There is associated mild mitral regurgitation   6. Abnormal LV diastolic function.   7. Normal PA systolic pressure. Normal left atrial size   8. There are no prior studies on this patient for comparison purposes.     Stress echo 04/2022  SUMMARY:   1. Fair exercise tolerance. The patient exercised for 6 min and 32 sec to stage III, achieving 8.0 METs.   2. Blood pressure response to stress was hypertensive (240/82 mmHg).   3. Heart rate response to stress was adequate (>85% MPHR).   4. Stress test was adequate for inducing target heart rate and/or exercise response.   5. Moderate PACs observed during recovery. Patient asymptomatic.   6. Patient experienced shortness of breath during peak exercise. No chest pain reported.   7. Patient reported hypertensive blood pressure readings on the left arm. Last blood pressure recorded was obtained from the right arm.   8. ECG findings are not suggestive of ischemia.   9. Echocardiogram is not suggestive of ischemia to the extent achieved.     Echo 11/2022  CONCLUSIONS   1. Normal left ventricular size.   2. Mild concentric left ventricular hypertrophy.   3. Left ventricular ejection fraction is approximately 65 to 70%.   4. Diastolic function is indeterminant due to mitral valve pathology.   5. 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 Mean gradient across mitral valve is 8.0 mmHg at a heart rate of 78 bpm.   6. Mild mitral valve regurgitation.   7. Upper normal left atrium in size.   8. Compared to prior study on 06/24/22, there are no significant changes.    ECG 04/28/22 Reviewed by me: Normal sinus rhythm 81 bpm,       IMPRESSION  Jamie Cohen is a 47 y.o. female with:  Rheumatic mitral stenosis - moderate on echo 11/2022  Exercise induced PACs  HTN  HL - LDL 126 without statin  Prediabetes  Morbid obesity    RECOMMENDATIONS/PLAN  - lisinopril 20 mg  - triamterene-HCTZ 37.5-25 mg -> takes half tablet  - INCREASE metoprolol succinate 25 mg daily to twice a day to target HR in 60s at rest  - atorvastatin 40 mg -> due for repeat labs  - echo in 6 months to evaluate MS progression  - low intensity steady state exercise is preferable to high intensity interval training - stay below 70-75% max predicted HR range  - Counseled patient on importance of diet and exercise and encouraged adherence to a heart-healthy diet.  - return visit 6 months for symptom assessment, will do echo annually or sooner if exertional symptoms develop    The above recommendations were discussed in detail with the patient.  All questions were answered satisfactorily and the patient is in full agreement with this recommended plan of care.    Thank you, Jamie Cohen, * for involving me in your patient's care.  Please feel free to contact me with any questions or concerns.    AUTHOR:   Roseanne Reno, MD  Clinical Instructor   [facility] Interventional Cardiology  [address]  [address]  [place], [place] [address]  [phone]    01/02/2023 9:35 AM    Complex medical decision making

## 2023-01-11 MED ORDER — MOUNJARO 10 MG/0.5ML SC SOPN
0 refills
Start: 2023-01-11 — End: ?

## 2023-01-16 ENCOUNTER — Ambulatory Visit: Payer: PRIVATE HEALTH INSURANCE | Attending: Medical

## 2023-01-16 ENCOUNTER — Ambulatory Visit: Payer: PRIVATE HEALTH INSURANCE

## 2023-01-16 DIAGNOSIS — R52 Pain, unspecified: Secondary | ICD-10-CM

## 2023-01-16 DIAGNOSIS — M65941 Tenosynovitis of right hand: Secondary | ICD-10-CM

## 2023-01-16 DIAGNOSIS — G5601 Carpal tunnel syndrome, right upper limb: Secondary | ICD-10-CM

## 2023-01-16 NOTE — Progress Notes
Date:  01/16/2023    Name:  Jamie Cohen  MRN:   [id number]  DOB:   [birth year]  Age:   47 y.o.      Jamie A.  [name], MD  [name], PA-C    The patient was seen in our office by [name], PA-C for [name], MD.      Chief Complaint: Right hand pain     History: [facility] presents today for evaluation regarding the above chief complaint.Trea Jamie Cohen is a 47 y.o. female who presents with a several month history of symptoms after no injury.     The pain is localized in the right hand. She notes swelling and notes numbness. Moreover, the symptoms are exacerbated by use and are alleviated by rest.      Past Medical History and Review of Systems: The patient?s intake sheet, including past medical history, past surgical history, medicines, allergies, social and family history and review of systems was reviewed by myself with the patient today. There are no pertinent changes from the previous visit. Details are noted and documented in the patient?s file.    Past Medical History:   Diagnosis Date    Chicken pox     Headache     Heart murmur     Hyperlipidemia     Hypertension     Sexually transmitted infection     Sleep apnea        Social History     Socioeconomic History    Marital status: Other   Tobacco Use    Smoking status: Never    Smokeless tobacco: Never   Substance and Sexual Activity    Alcohol use: Yes     Comment: Rarely, few times a year    Drug use: Never    Sexual activity: Not Currently     Birth control/protection: IUD (non hormonal)   Other Topics Concern    Do you exercise at least a day, 3 or more days a week? No    Do you follow a special diet? No    Vegan? No    Vegetarian? No    Pescatarian? No    Lactose Free? No    Gluten Free? No    Omnivore? Yes             Physical Exam:   There were no vitals filed for this visit.   and   There were no vitals filed for this visit.      Constitutional:  Well appearing, no apparent distress.  The patient?s general appearance is well dressed well nourished. The body habitus is normal.    Psychiatric: Patient is alert and oriented to person, time and place, with a pleasant mood and affect.      Eyes: Extraocular movements are intact, pupils are symmetric. No conjunctivitis or icterus is present; eyelids appear normal.     Skin: No lesions or rash noted.     Musculoskeletal and Neurological: Sensation intact to light touch. 2 point discrimination is less than 6mm in all digits unless otherwise noted.  Motor exam is 5/5 throughout bilateral upper extremities.   + Tinel right carpal tunnel.  Tenderness, swelling, and cyst right thumb flexor sheath.     Test Results:  EMG 05/25/2022: My independent review of the nerve conduction study shows there is bilateral carpal tunnel. These are consistent with the report stated below.   This is an abnormal electrodiagnostic examination.   There is electrodiagnostic evidence suggestive of a  right median neuropathy across the wrist, affecting the sensory and motor fibers. There is evidence of both demyelination and axon loss. These findings are consistent with a diagnosis of right carpal tunnel syndrome, and the electrodiagnostic abnormalities are moderate. There is no electrodiagnostic evidence of a right ulnar neuropathy across the wrist or elbow. There is no electrodiagnostic evidence of a right cervical radiculopathy.   Comparative testing to the contralateral limb suggests a mild left carpal syndrome. Correlate clinically.      Radiographs: PREVIOUS 3 views of the right hand which were ordered, obtained and evaluated by me here in the [facility] office today shows no evidence of fracture, arthritis or subluxation.     Impression:  Left carpal tunnel syndrome    Right carpal tunnel syndrome, improved with CSI x 1  Right thumb flexor tenosynovitis with cyst, moderate improvement with CSI x 1         Plan: Findings, diagnoses, prognosis, and treatment options were reviewed and discussed with the patient, with all questions answered.     We will submit for a right carpal tunnel release, open & right thumb flexor tenosynovectomy.       Next Appointment: Preoperatively CL  [x]  Reassessment of pain  [x]  No X-Ray  []  X-rays     I recommend a course of treatment as checked below:    []  Splinting/Bracing/Immobilization   []  Aspiration/Injection  []  Fracture Care  []  Decision for Surgery  []  Referral to PT/OT for formal program  []  Imaging study  []  Consultation for electrodiagnostic studies  []  Inflammatory labs  []  Prescription: Meloxicam 7.5mg  orally once daily for 30 days  []  Prescription: Norco 5/325mg  as needed orally once every 6 hours for pain  []  Prescription: Tramadol 50mg  as needed orally every 6 hours for pain  []  Prescription: Voltaren Gel     The patient was examined and evaluated by [name], PA-C for [name], MD.  The evaluation and plan was reviewed and approved by [name], MD.        Procedures

## 2023-02-01 ENCOUNTER — Telehealth: Payer: PRIVATE HEALTH INSURANCE

## 2023-02-01 MED ORDER — TIRZEPATIDE 10 MG/0.5ML SC SOPN
0 refills | Status: AC
Start: 2023-02-01 — End: ?

## 2023-02-01 NOTE — Telephone Encounter
Message to Practice/Provider      Message: Jamie Cohen, patient called back and stated that she is on Mounjaro 10 mg. Also patient stated that she has her physical on 02/07/2023.     Patient or caller has been notified that their message will be reviewed within 1-2 business days.

## 2023-02-01 NOTE — Telephone Encounter
Called patient no answer, left message to return our office's call. Received a fax for medication refill unsure if patient is still on medication Mounjaro 7.5mg  was last filled on 08/26/22 and was last seen for a Video Visit on 07/07/22.     Needs follow up in office appointment        Megan Mans, [place]  02/01/2023 2:28 PM

## 2023-02-02 ENCOUNTER — Telehealth: Payer: BC Managed Care – HMO

## 2023-02-02 NOTE — Telephone Encounter
Dr. Ethelle Lyon Prescription sent to pharmacy    Rochele Pages [name] 02/01/2023 4:01 PM

## 2023-02-03 ENCOUNTER — Telehealth: Payer: PRIVATE HEALTH INSURANCE

## 2023-02-06 ENCOUNTER — Ambulatory Visit: Payer: PRIVATE HEALTH INSURANCE | Attending: Medical

## 2023-02-06 ENCOUNTER — Ambulatory Visit: Payer: PRIVATE HEALTH INSURANCE

## 2023-02-06 ENCOUNTER — Telehealth: Payer: PRIVATE HEALTH INSURANCE

## 2023-02-06 DIAGNOSIS — G5601 Carpal tunnel syndrome, right upper limb: Secondary | ICD-10-CM

## 2023-02-06 DIAGNOSIS — M65941 Tenosynovitis of right hand: Secondary | ICD-10-CM

## 2023-02-06 MED ORDER — TRAMADOL HCL 50 MG PO TABS
50 mg | ORAL_TABLET | Freq: Four times a day (QID) | ORAL | 0 refills | Status: AC | PRN
Start: 2023-02-06 — End: ?

## 2023-02-06 NOTE — Progress Notes
Date:  02/06/2023    Name:  Jamie Cohen  MRN:   [id number]  DOB:   May 21, 1975  Age:   47 y.o.      Jamie A.  Jamie Fusi, MD  [name], PA-C    The patient was seen in our office by [name], PA-C for Jamie Stain, MD.      Chief Complaint: Right hand pain     History: Jamie Cohen presents today for evaluation regarding the above chief complaint.Jamie Cohen is a 47 y.o. female who presents with a several month history of symptoms after no injury.     The pain is localized in the right hand. She notes swelling and notes numbness. Moreover, the symptoms are exacerbated by use and are alleviated by rest.      Past Medical History and Review of Systems: The patient?s intake sheet, including past medical history, past surgical history, medicines, allergies, social and family history and review of systems was reviewed by myself with the patient today. There are no pertinent changes from the previous visit. Details are noted and documented in the patient?s file.    Past Medical History:   Diagnosis Date    Chicken pox     Headache     Heart murmur     Hyperlipidemia     Hypertension     Sexually transmitted infection     Sleep apnea        Social History     Socioeconomic History    Marital status: Other   Tobacco Use    Smoking status: Never    Smokeless tobacco: Never   Substance and Sexual Activity    Alcohol use: Yes     Comment: Rarely, few times a year    Drug use: Never    Sexual activity: Not Currently     Birth control/protection: IUD (non hormonal)   Other Topics Concern    Do you exercise at least a day, 3 or more days a week? No    Do you follow a special diet? No    Vegan? No    Vegetarian? No    Pescatarian? No    Lactose Free? No    Gluten Free? No    Omnivore? Yes             Physical Exam:   Vitals:    02/06/23 1030   Weight: (!) 268 lb (121.6 kg)   Height: 5' 4'' (1.626 m)      and   Vitals:    02/06/23 1030   Weight: (!) 268 lb (121.6 kg)   Height: 5' 4'' (1.626 m) Constitutional:  Well appearing, no apparent distress.  The patient?s general appearance is well dressed well nourished. The body habitus is normal.    Psychiatric: Patient is alert and oriented to person, time and place, with a pleasant mood and affect.      Eyes: Extraocular movements are intact, pupils are symmetric. No conjunctivitis or icterus is present; eyelids appear normal.     Skin: No lesions or rash noted.     Musculoskeletal and Neurological: Sensation intact to light touch. 2 point discrimination is less than 6mm in all digits unless otherwise noted.  Motor exam is 5/5 throughout bilateral upper extremities.   + Tinel right carpal tunnel.  Tenderness, swelling, and cyst right thumb flexor sheath.     Test Results:  EMG 05/25/2022: My independent review of the nerve conduction study shows there is bilateral carpal tunnel. These  are consistent with the report stated below.   This is an abnormal electrodiagnostic examination.   There is electrodiagnostic evidence suggestive of a right median neuropathy across the wrist, affecting the sensory and motor fibers. There is evidence of both demyelination and axon loss. These findings are consistent with a diagnosis of right carpal tunnel syndrome, and the electrodiagnostic abnormalities are moderate. There is no electrodiagnostic evidence of a right ulnar neuropathy across the wrist or elbow. There is no electrodiagnostic evidence of a right cervical radiculopathy.   Comparative testing to the contralateral limb suggests a mild left carpal syndrome. Correlate clinically.      Radiographs: PREVIOUS 3 views of the right hand which were ordered, obtained and evaluated by me here in the [facility] office today shows no evidence of fracture, arthritis or subluxation.     Impression:  Left carpal tunnel syndrome    Right carpal tunnel syndrome, improved with CSI x 1  Right thumb flexor tenosynovitis with cyst, moderate improvement with CSI x 1         Plan: Findings, diagnoses, prognosis, and treatment options were reviewed and discussed with the patient, with all questions answered.     The surgical procedure was determined by Dr Jamie Cohen. Plan to proceed with the Right Thumb tenosynovectomy of the flexor pollicis longus tendon at the flexor sheath. Right carpal tunnel release, open     Due to the nature of the injury/condition, and with unsuccessful non-operative treatment, including rest, activity modification, NSAIDs, immobilization, Physical/Occupational Therapy, and corticosteroid injections, recommend proceeding with surgical intervention.    The risks, benefits, alternatives to care, and potential complications of both surgical and non-surgical treatments were discussed with the patient.  The risks and complications of the surgery were explained to the patient and include, but are not limited to, the risk of anesthesia, which includes heart attacks, stroke, and death, the risks of infection, bleeding, risk of injury to arteries, nerves, ligaments and tendons, possible need for additional surgery, risk of continued pain, stiffness, and weakness, or recurrence of the problem, or the possibility of complex regional pain syndrome. There is also a risk of hematoma formation, formation of deep vein thrombosis, wound healing problems, painful scar or reaction to implants.  All of the above might occur despite adequate surgery.  The patient also understands that they have a responsibility in their post-operative care and it is important that post-operative instructions are followed regarding bandage care, wound care, range of motion exercises and specific precautions.  The patient wishes to proceed with the above named surgical procedure(s) understanding the above risks, complications and benefits.  I have discussed with the patient the details, necessity, and appropriateness of the proposed procedure, as well as alternative treatments. The patient?s questions were answered and the informed consent was signed and witnessed.    The patient is an appropriate candidate for surgery in the outpatient setting from an orthopedic perspective, pending medical pre-operative clearance if needed.    She was provided with a prescription for pain medication. Reviewed proper instructions including risks associated with opioid medication.       Next Appointment: 2 weeks CL  [x]  Reassessment of pain  [x]  No X-Ray  []  X-rays     I recommend a course of treatment as checked below:    []  Splinting/Bracing/Immobilization   []  Aspiration/Injection  []  Fracture Care  [x]  Decision for Surgery  []  Referral to PT/OT for formal program  []  Imaging study  []  Consultation for electrodiagnostic studies  []   Inflammatory labs  []  Prescription: Meloxicam 7.5mg  orally once daily for 30 days  []  Prescription: Norco 5/325mg  as needed orally once every 6 hours for pain  []  Prescription: Tramadol 50mg  as needed orally every 6 hours for pain  []  Prescription: Voltaren Gel     The patient was examined and evaluated by [name], PA-C for Jamie Stain, MD.  The evaluation and plan was reviewed and approved by Jamie Stain, MD.

## 2023-02-07 ENCOUNTER — Ambulatory Visit: Payer: PRIVATE HEALTH INSURANCE | Attending: Student in an Organized Health Care Education/Training Program

## 2023-02-07 ENCOUNTER — Telehealth: Payer: PRIVATE HEALTH INSURANCE

## 2023-02-07 ENCOUNTER — Ambulatory Visit: Payer: PRIVATE HEALTH INSURANCE

## 2023-02-07 NOTE — Telephone Encounter
PDL Call to Clinic    Reason for Call: Patient is asking if she can change her physical appointment to pre-op, she has surgery December 19th.     Appointment Related?  [x]  Yes  []  No     If yes;  Date:02/07/2023  Time:8 am     Call warm transferred to PDL: [x]  Yes  []  No    Call Received by Clinic Representative: [email or web address]     If call not answered/not accepted, call received by Patient Services Representative:

## 2023-02-07 NOTE — Telephone Encounter
Requesting CB from manager pt refused to disclose reason    FYI pt came in 20 min late for preop appt, pt was referred to [facility] [phone]

## 2023-02-07 NOTE — Telephone Encounter
Call Back Request      Reason for call back: Pt is calling to speak with kevin. Pt did not want to give any other details.  Please advise   [phone]    Any Symptoms:  []  Yes  [x]  No      If yes, what symptoms are you experiencing:    Duration of symptoms (how long):    Have you taken medication for symptoms (OTC or Rx):      If call was taken outside of clinic hours:    [] Patient or caller has been notified that this message was sent outside of normal clinic hours.     [] Patient or caller has been warm transferred to the physician's answering service. If applicable, patient or caller informed to please call us back if symptoms progress.  Patient or caller has been notified of the turnaround time of 1-2 business day(s).

## 2023-02-07 NOTE — Telephone Encounter
[facility]  [address]  [address]  [place], [place] [address]  [phone] Patient:  DOB:  MRN:  Date:  PCP: Kerrilynn Curatola  06-Dec-1975   [id number]   02/07/2023  No primary care provider on file.        CARDIOLOGY OUTPATIENT HISTORY AND PHYSICAL EXAMINATION    REQUESTING PHYSICIAN.  No ref. provider found    REASON FOR CONSULTATION: HTN    HISTORY OF PRESENT ILLNESS:  Jamie Cohen is a 47 y.o. female with a past medical history significant for HTN, HL who presents for comprehensive cardiovascular evaluation.    Just moved to [place] from [place]. History of heart murmur. Hx of HTN and HL. Automated BP cuffs dont work for her.     Denies chest pain, shortness of breath, dyspnea on exertion, orthopnea, paroxysmal noctural dyspnea, leg swelling, palpitations, lightheadedness, near syncope or syncope.    Exercise: fairly sedentary, busy from work.     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 No drugs. Rare alcohol. No children. One pregnancy. - no complications.     Family history: dad had CHF in old age. Mom died of kidney disease.      07/27/2022  Post echo and stress. Exercise induced PACs, no sustained arrhythmias or ischemia. Mod MS. Has had SOB since COVID. Thought it was long COVID. She can walk up and down stairs and feels more winded than she would expect. She is fine on flat ground. She walks around [employer] with no issues.  Feels no irregular heartbeats or palpitations.    01/02/23  Overall better on metoprolol. Less tired sluggish, more walking ability. Interested in running. She was in the [employer].    Additional ROS negative unless otherwise noted.    MEDICATIONS.   Current Outpatient Medications   Medication Sig    atorvastatin (LIPITOR) 40 mg tablet Take 1 tablet (40 mg total) by mouth daily.    lisinopril 20 mg tablet Take 1 tablet (20 mg total) by mouth daily.    metoprolol succinate 25 mg 24 hr tablet Take 1 tablet (25 mg total) by mouth two (2) times daily.    mirabegron (MYRBETRIQ) 25 mg ER tablet Take 1 tablet (25 mg total) by mouth daily.    sodium sulfate-potassium sulfate-magnesium sulfate solution At 6pm the evening before colonoscopy, drink 16oz x1 dose, then drink 32oz water over 1 hour. At 5am the day of colonoscopy, repeat above. (Patient not taking: Reported on 07/07/2022)    tirzepatide (MOUNJARO) 10 mg/0.5 mL SC injection INJECT 0.5 ML SUBCUTANEOUSLY ONCE WEEKLY    tirzepatide (MOUNJARO) 7.5 mg/0.5 mL SC injection Inject 0.5 mLs (7.5 mg total) under the skin once a week.    traMADol 50 mg tablet Take 1 tablet (50 mg total) by mouth every six (6) hours as needed. Max Daily Amount: 200 mg    triamterene-hydroCHLOROthiazide 37.5-25 mg capsule Take 1 capsule by mouth daily.    valACYclovir 1000 mg tablet  (Patient not taking: Reported on 01/02/2023)    [DISCONTINUED] tirzepatide (MOUNJARO) 10 mg/0.5 mL injection Inject 0.5 mLs (10 mg total) under the skin once a week.     No current facility-administered medications for this visit.      ALLERGIES.  Allergies   Allergen Reactions    Atorvastatin Diarrhea and Nausea And Vomiting     Patient states tolerates BRAND only    Oxycodone      PAST MEDICAL HISTORY.  Past Medical History:  Diagnosis Date    Chicken pox     Headache     Heart murmur     Hyperlipidemia     Hypertension     Sexually transmitted infection     Sleep apnea      PAST SURGICAL HISTORY.  Past Surgical History:   Procedure Laterality Date    BREAST SURGERY       FAMILY HISTORY.   Family History   Problem Relation Age of Onset    Hypothyroidism Mother     Kidney disease Father     Hypothyroidism Sister      SOCIAL HISTORY.   Social History     Socioeconomic History    Marital status: Other   Tobacco Use    Smoking status: Never    Smokeless tobacco: Never   Substance and Sexual Activity    Alcohol use: Yes     Comment: Rarely, few times a year    Drug use: Never    Sexual activity: Not Currently     Birth control/protection: IUD (non hormonal) Other Topics Concern    Do you exercise at least a day, 3 or more days a week? No    Do you follow a special diet? No    Vegan? No    Vegetarian? No    Pescatarian? No    Lactose Free? No    Gluten Free? No    Omnivore? Yes       PHYSICAL EXAMINATION.    Vitals Current      Temp           BP     116/74       HR    72      RR    16      Sats    96 %      Weight    (!) 267 lb (121.1 kg)  There is no height or weight on file to calculate BMI.     System Check if normal Positive or additional negative findings   Constit  [x]  General appearance     Eyes  []  Conj/Lids []  Pupils  []  Fundi     HEENT  [x]  External ears/nose []  Otoscopy   [x]  Gross Hearing []  Nasal mucosa   []  Lips/teeth/gums []  Oropharynx    []  mucus membranes [x]  Head     Neck  []  Inspection/palpation []  Thyroid     Resp  [x]  Effort []  Wheezing    [x]  Auscultation  []  Crackles     CV  [x]  Rhythm/rate   []  Murmurs   []  Lower extremity edema   [x]  JVP non-elevated    [x]  Carotid  auscultation normal   Normal pulses:   [x]  Radial []  Femoral  []  Pedal   Diastolic murmur    GI  []  abd masses    []  tenderness   []  rebound/guarding   []  Liver/spleen []  Rectal     Neuro  []  CN2-12 intact grossly   [x]  Alert and oriented   []  DTR      []  Muscle strength      []  Sensation   []  Gait/balance     Psych  [x]  Insight/judgement     []  Mood/affect    [x]  Gross cognition          LABORATORY STUDIES.   Reviewed during this encounter    Complete Blood Count Metabolic Panel Lipid Analysis   Lab Results   Component Value Date  WBC 7.59 08/09/2022    HGB 13.6 08/09/2022    HCT 42.4 08/09/2022    PLT 537 (H) 08/09/2022    Lab Results   Component Value Date    NA 137 08/09/2022    K 4.2 08/09/2022    BUN 18 08/09/2022    CREAT 0.74 08/09/2022    GLUCOSE 97 08/09/2022    AST 26 08/09/2022    ALT 30 08/09/2022    ALKPHOS 86 08/09/2022    BILITOT 0.2 08/09/2022    Lab Results   Component Value Date    CHOL 193 03/30/2022    CHOLDLCAL 126 (H) 03/30/2022    CHOLHDL 44 (L) 03/30/2022    NOHDLCHOCAL 149 (H) 03/30/2022    TRIGLY 114 03/30/2022      No results found for: ''TROPONIN'', ''BNP''  Lab Results   Component Value Date    HGBA1C 6.5 (H) 08/02/2022       DIAGNOSTIC STUDIES.    TTE Reviewed by me  Echo adult transthoracic complete    Result Date: 12/28/2022   1. Normal left ventricular size.   2. Mild concentric left ventricular hypertrophy.   3. Left ventricular ejection fraction is approximately 65 to 70%.   4. Diastolic function is indeterminant due to mitral valve pathology.   5. 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 Mean gradient across mitral valve is 8.0 mmHg at a heart rate of 78 bpm.   6. Mild mitral valve regurgitation.   7. Upper normal left atrium in size.   8. Compared to prior study on 06/24/22, there are no significant changes.  [id number] Roseanne Reno MD  Electronically signed by [id number] Roseanne Reno MD on 12/28/2022 at 4:43:22 PM     Sonographer: Nonah Mattes      Final       Echo adult transthoracic complete    Result Date: 06/27/2022   1. Technically difficult study.   2. Normal left ventricular size.   3. Mild septal left ventricular hypertrophy.   4. The calculated ejection fraction (Simpson's) is 62 %.   5. 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 The mean diastolic gradient across the mitral valve is 8.0 mmHg at a heart rate of 80 bpm. There is associated mild mitral regurgitation   6. Abnormal LV diastolic function.   7. Normal PA systolic pressure. Normal left atrial size   8. There are no prior studies on this patient for comparison purposes.  [id number] Roseanne Reno MD  Electronically signed by [id number] Roseanne Reno MD on 06/27/2022 at 9:57:44 AM     Sonographer: Jamse Belfast      Final         ACC/AHA Cholesterol Management Guideline Recommendations:   Continuation of statin treatment, as indicated based on pre-treatment risk assessment.      ASCVD Risk Score    10-year ASCVD risk  is 2.1% as of 11:56 AM on 02/07/2023. 10-year ASCVD risk with optimal risk factors  is 0.6%.   Values used to calculate ASCVD Risk Score    Age  67 y.o.     Gender  female   Race  African American   HDL Cholesterol  44 mg/dL. (measured on 03/30/2022)   LDL Cholesterol  126 mg/dL. (measured on 03/30/2022)   Total Cholesterol  193 mg/dL. (measured on 03/30/2022)   Systolic Blood Pressure  114 mm Hg. (measured on 02/06/2023)   Blood Pressure Medication Present  Yes   Smoking Status  currently not a smoker   Diabetes  Present  No     Click here for the [facility] ASCVD Cardiovascular Risk Estimator Plus tool Office manager).    Echo 05/2022  CONCLUSIONS   1. Technically difficult study.   2. Normal left ventricular size.   3. Mild septal left ventricular hypertrophy.   4. The calculated ejection fraction (Simpson's) is 62 %.   5. 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 The mean diastolic gradient across the mitral valve is 8.0 mmHg at a heart rate of 80 bpm. There is associated mild mitral regurgitation   6. Abnormal LV diastolic function.   7. Normal PA systolic pressure. Normal left atrial size   8. There are no prior studies on this patient for comparison purposes.     Stress echo 04/2022  SUMMARY:   1. Fair exercise tolerance. The patient exercised for 6 min and 32 sec to stage III, achieving 8.0 METs.   2. Blood pressure response to stress was hypertensive (240/82 mmHg).   3. Heart rate response to stress was adequate (>85% MPHR).   4. Stress test was adequate for inducing target heart rate and/or exercise response.   5. Moderate PACs observed during recovery. Patient asymptomatic.   6. Patient experienced shortness of breath during peak exercise. No chest pain reported.   7. Patient reported hypertensive blood pressure readings on the left arm. Last blood pressure recorded was obtained from the right arm.   8. ECG findings are not suggestive of ischemia.   9. Echocardiogram is not suggestive of ischemia to the extent achieved.     Echo 11/2022  CONCLUSIONS   1. Normal left ventricular size.   2. Mild concentric left ventricular hypertrophy.   3. Left ventricular ejection fraction is approximately 65 to 70%.   4. Diastolic function is indeterminant due to mitral valve pathology.   5. 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 Mean gradient across mitral valve is 8.0 mmHg at a heart rate of 78 bpm.   6. Mild mitral valve regurgitation.   7. Upper normal left atrium in size.   8. Compared to prior study on 06/24/22, there are no significant changes.    ECG 04/28/22 Reviewed by me: Normal sinus rhythm 81 bpm,       IMPRESSION  Jamie Cohen is a 47 y.o. female with:  Rheumatic mitral stenosis - moderate on echo 11/2022  Exercise induced PACs  HTN  HL - LDL 126 without statin  Prediabetes  Morbid obesity    RECOMMENDATIONS/PLAN  - moderate MS, no cardiac contraindication for surgery  - lisinopril 20 mg  - triamterene-HCTZ 37.5-25 mg -> takes half tablet  - INCREASE metoprolol succinate 25 mg daily to twice a day to target HR in 60s at rest  - atorvastatin 40 mg -> due for repeat labs  - echo in 6 months to evaluate MS progression  - low intensity steady state exercise is preferable to high intensity interval training - stay below 70-75% max predicted HR range  - Counseled patient on importance of diet and exercise and encouraged adherence to a heart-healthy diet.  - return visit 6 months for symptom assessment, will do echo annually or sooner if exertional symptoms develop    The above recommendations were discussed in detail with the patient.  All questions were answered satisfactorily and the patient is in full agreement with this recommended plan of care.    Thank you, No ref. provider found for involving me in your patient's care.  Please feel free to contact  me with any questions or concerns.    AUTHOR:   Roseanne Reno, MD  Clinical Instructor   [facility]  [address]  [address]  [place], [place] [address]  [phone]    02/07/2023 11:56 AM    Complex medical decision making

## 2023-02-10 ENCOUNTER — Institutional Professional Consult (permissible substitution): Payer: PRIVATE HEALTH INSURANCE

## 2023-02-10 DIAGNOSIS — Z01818 Encounter for other preprocedural examination: Secondary | ICD-10-CM

## 2023-02-10 NOTE — Telephone Encounter
Called patient and left a voicemail for her to return the call.

## 2023-02-11 ENCOUNTER — Inpatient Hospital Stay: Payer: PRIVATE HEALTH INSURANCE

## 2023-02-11 NOTE — Patient Instructions
Hold tirzepatide 1 week before surgery.    7 days before your surgery, you should avoid NSAID medications including ibuprofen (advil) and naproxen (aleve). For pain, you can take acetaminophen (tylenol) according to the package instructions. Please feel free to call our clinic if you have questions about over the counter medications you can take prior to surgery.

## 2023-02-11 NOTE — Consults
[facility] INTERNAL MEDICINE    ** PREOPERATIVE MEDICINE CONSULTATION NOTE **     Patient Name: Jamie Cohen   Patient MRN: [id number]   Date of Birth: September 18, 1975   Date of Consultation: 02/10/2023        PRIMARY CARE PHYSICIAN:  No primary care provider on file.  SUBSPECIALTY PHYSICIANS:  Dr. Cherlyn Cushing cardiology  REFERRING PHYSICIAN/SURGEON:  Dr. [name]  PLANNED SURGERY:  Right Thumb tenosynovectomy of the flexor pollicis longus tendon at the flexor sheath. Right carpal tunnel release, open   PLANNED SURGERY DATE:  02/16/2023  PLANNED ANESTHESIA:  General    CHIEF COMPLAINT / REASON FOR CONSULTATION:  Preoperative medical evaluation.     SOURCE OF INFORMATION:  The following history was generated by an interview with the patient, as well as a review of medical records available in [facility].    HISTORY OF PRESENT ILLNESS:  Jamie Cohen is a 47 y.o. female with hypertension, hyperlipidemia, mitral stenosis, thrombocytosis, diabetes.  The patient is presenting to outpatient clinic today for preoperative medical evaluation prior to right hand and wrist surgery.     The patient has a medical history that includes (check all that apply; provide details as needed):   []   Coronary artery disease.   []   Cardiac arrhythmia.   [x]   Valvular heart disease. Mitral stenosis  []   Cerebrovascular disease (history of CVA, TIA).   []   Heart failure.   [x]   Diabetes mellitus.   []   Thyroid disease.   []   Renal disease.   []   Liver disease.   []   Obstructive lung disease (COPD, asthma, etc.).   []   Obstructive sleep apnea.   []   Pulmonary hypertension.   []   Current inhaled tobacco smoker.   []   Venous thromboembolic disease (PE, DVT).   []   Hematological and/or oncological disease.     []   None of the above.      Preoperative functional status (check one):   [x]   Totally independent.   []   Partially dependent.   []   Totally dependent.      Exercise/activity tolerance:  The patient is able to exert herself without chest pain, shortness of breath, presyncope, syncope, or other cardiopulmonary symptoms.     PAST MEDICAL/SURGICAL HISTORY:   Patient Active Problem List    Diagnosis Date Noted    Calcific tendinitis of unspecified shoulder 08/19/2022    Thrombocytosis, unspecified 08/09/2022    Calcific tendonitis of left shoulder 04/25/2022    Subscapular bursitis 04/25/2022     Past Surgical History:   Procedure Laterality Date    BREAST SURGERY      WISDOM TOOTH EXTRACTION          * Complications with any surgical procedure listed above: No.   * Personal history of adverse reaction to anesthesia: No.     MEDICATIONS:   Medications that the patient states to be currently taking   Medication Sig    lisinopril 20 mg tablet Take 1 tablet (20 mg total) by mouth daily.    metoprolol succinate 25 mg 24 hr tablet Take 1 tablet (25 mg total) by mouth two (2) times daily.    mirabegron (MYRBETRIQ) 25 mg ER tablet Take 1 tablet (25 mg total) by mouth daily.    tirzepatide (MOUNJARO) 10 mg/0.5 mL SC injection INJECT 0.5 ML SUBCUTANEOUSLY ONCE WEEKLY    traMADol 50 mg tablet Take 1 tablet (50 mg total) by mouth every six (6) hours as needed. Max  Daily Amount: 200 mg    triamterene-hydroCHLOROthiazide 37.5-25 mg capsule Take 1 capsule by mouth daily.        ALLERGIES: Atorvastatin and Oxycodone    FAMILY HISTORY: family history includes Hypothyroidism in her mother and sister; Kidney disease in her father.      * Family history of adverse reaction to anesthesia: No.     ROS: A 14-system review was performed and was otherwise negative except as noted above in the HPI and as follows:  negative except as stated above.    PHYSICAL EXAMINATION:   BP 110/77  ~ Pulse 87  ~ Temp 36.7 ?C (98 ?F) (Oral)  ~ Resp 8  ~ Ht 5' 4'' (1.626 m)  ~ Wt (!) 268 lb (121.6 kg)  ~ LMP 01/26/2023 (Approximate)  ~ SpO2 95%  ~ BMI 46.00 kg/m?    General appearance: awake, appears stated age, cooperative and in no acute distress  Eyes: conjunctivae and sclerae normal, pupils equal, round, reactive to light and accomodation and EOMI  Throat: lips, mucosa, and tongue normal; teeth and gums normal  Neck: no adenopathy, supple, symmetrical, trachea midline and thyroid not enlarged, symmetric, no tenderness/mass/nodules  Lungs: clear to auscultation bilaterally  Heart: regular rate and rhythm, S1, S2 normal, no murmur, click, rub or gallop  Skin: Skin color, texture, turgor normal. No rashes or lesions  Ext: no LE edema noted  Neuro: A&Ox4    LABORATORY STUDIES:  Lab Results   Component Value Date    NA 137 08/09/2022    K 4.2 08/09/2022    CL 101 08/09/2022    CO2 24 08/09/2022    BUN 18 08/09/2022    CREAT 0.74 08/09/2022    GLUCOSE 97 08/09/2022    CALCIUM 10.3 08/09/2022    ALT 30 08/09/2022    AST 26 08/09/2022    BILITOT 0.2 08/09/2022    ALKPHOS 86 08/09/2022    ALBUMIN 4.1 08/09/2022    ALBUMIN 4.0 08/09/2022    HGBA1C 6.5 (H) 08/02/2022    TSH 2.0 03/30/2022    WBC 7.59 08/09/2022    HGB 13.6 08/09/2022    HCT 42.4 08/09/2022    MCV 88.5 08/09/2022    PLT 537 (H) 08/09/2022       OTHER STUDIES:  EKG: normal EKG, normal sinus rhythm, unchanged from previous tracings.     ===================================================================    VISIT DIAGNOSES:   1. Preop exam for internal medicine        ===================================================================    IMPRESSION/RISK ASSESSMENT:     RCRI CALCULATOR   []  High-risk type of surgery (intraperitoneal, intrathoracic, or suprainguinal vascular surgery)   []  History of ischemic heart disease (history of MI or a positive exercise test, current complaint of chest pain considered to be secondary to myocardial ischemia, use of nitrate therapy, or ECG with pathological Q waves; do not count prior coronary revascularization procedure unless one of the other criteria for ischemic heart disease is present)   []  History of HF   []  History of cerebrovascular disease   []  Diabetes mellitus requiring treatment with insulin []  Preoperative serum creatinine >2.0 mg/dL (161 ?mol/L)    Total: 0    Key:  1 point = class 2 risk (0.9% risk of major CV event*)   2 points = class 3 risk (6.6%  risk of major CV event)  >= 3 points = class 4 risk (11% risk of major CV event)  *Major CV event defined as: (1) AMI (2)  Pulmonary edema (3) Vfib or cardiac arrest (4) Complete heart block    The patient is low-risk for perioperative major cardiovascular risk for the proposed surgery (by RCRI)  Functional capacity (based on history obtained in clinic today): > or = 4 METS.   ASA physical status classification: II: Mild to moderate systemic disease, medically well controlled, with no functional limitation..  Risk factors for perioperative pulmonary complications include: general anesthesia*.    Preoperative OSA screening: STOPBANG = 3/8.      RECOMMENDATIONS:   From a medical standpoint, the patient is appropriately risk-stratified and medically optimized for the proposed surgery.   The patient does not have active medical issues that need to be addressed prior to proceeding with the proposed surgery.   Preoperative laboratory/diagnostic testing: Ordered, results pending.   Perioperative medication management:  The patient was advised to continue all current outpatient medications perioperatively, with the following exceptions:  Hold tirzepatide 1 week before surgery .  The patient was advised to avoid use of aspirin-containing products at least 7 days prior to surgery (10 days for neurosurgical and spine procedures), NSAID-containing products at least 7 days prior to surgery, and herbal medications/supplements at least 14 days prior to surgery.    Additional recommendations regarding perioperative management of chronic medical conditions as follows:  none  Recommend routine follow-up with primary care physician and/or subspecialty providers following surgery.        ICD-10-CM    1. Preop exam for internal medicine  Z01.818 ECG 12 lead     CBC     Basic Metabolic Panel     APTT     Prothrombin Time Panel     XR chest pa+lat (2 views)          PERIOPERATIVE MACE CALCULATORS:   * Revised Cardiac Risk Index (RCRI)  * ACS NSQIP MICA Risk Prediction Calculator  * ACS NSQIP Surgical Risk Calculator     Thank you for allowing me to participate in the care of this patient.  Please feel free to contact me with any questions regarding this patient's preoperative medical evaluation.      Future Appointments         Provider Department Visit Type    02/10/2023 4:30 PM Catheryn Bacon., MD [facility] [place] PREOP    02/16/2023 7:00 AM Cathrine Muster., MD [facility]  [facility]ERY    02/17/2023 4:00 PM Octavia Heir, MD [facility], MPTF [facility] WE[facility] ADULT [facility] (PHYSICAL)    02/28/2023 9:00 AM Audie Box., PA-C [facility] POST OP            Signed:   Su Ley, MD  02/10/23 4:14 PM

## 2023-02-17 ENCOUNTER — Ambulatory Visit: Payer: PRIVATE HEALTH INSURANCE | Attending: Adult Medicine

## 2023-02-17 DIAGNOSIS — M51362 Degeneration of intervertebral disc of lumbar region with discogenic back pain and lower extremity pain: Secondary | ICD-10-CM

## 2023-02-17 DIAGNOSIS — M778 Other enthesopathies, not elsewhere classified: Secondary | ICD-10-CM

## 2023-02-17 DIAGNOSIS — S99922S Unspecified injury of left foot, sequela: Secondary | ICD-10-CM

## 2023-02-17 DIAGNOSIS — Z0000 Encounter for general adult medical examination without abnormal findings: Secondary | ICD-10-CM

## 2023-02-17 DIAGNOSIS — E119 Type 2 diabetes mellitus without complications: Secondary | ICD-10-CM

## 2023-02-17 DIAGNOSIS — I10 Essential (primary) hypertension: Secondary | ICD-10-CM

## 2023-02-17 DIAGNOSIS — N3281 Overactive bladder: Secondary | ICD-10-CM

## 2023-02-18 MED ORDER — MOUNJARO 10 MG/0.5ML SC SOAJ
10 mg | SUBCUTANEOUS | 2 refills | Status: AC
Start: 2023-02-18 — End: ?

## 2023-02-18 MED ORDER — TRIAMTERENE-HCTZ 37.5-25 MG PO CAPS
1 | ORAL_CAPSULE | Freq: Every day | ORAL | 3 refills | Status: AC
Start: 2023-02-18 — End: ?

## 2023-02-18 MED ORDER — LISINOPRIL 20 MG PO TABS
20 mg | ORAL_TABLET | Freq: Every day | ORAL | 3 refills | 30.00000 days | Status: AC
Start: 2023-02-18 — End: ?

## 2023-02-18 MED ORDER — MIRABEGRON ER 25 MG PO TB24
25 mg | ORAL_TABLET | Freq: Every day | ORAL | 3 refills | Status: AC
Start: 2023-02-18 — End: ?

## 2023-02-18 NOTE — Progress Notes
[facility]  [place], MPTF [facility]  [phone]  [place] [address]  [phone]      PATIENT: Jamie Cohen   MRN: [id number]  DOB: Apr 18, 1975  DATE OF SERVICE: 02/17/2023  PROVIDER: Octavia Heir, MD    CHIEF COMPLAINT:   Chief Complaint   Patient presents with    Annual Exam        HPI   Jamie Cohen is a 47 y.o. female presents for annual exam.         A1c 6.2 -         Yesterday right trigger finger and ctr       PT on the left shoulder and Lumbar spine     H/o fall, and twisted the left ankle over 1-2 years ago. Was doing home pt exercises only.       Intermittent loose stool, BM 15 to 20 mins after eating.     Pending coloscopy w/ gastro.         Problem list/Past medical history:      Patient Active Problem List   Diagnosis    Calcific tendonitis of left shoulder    Subscapular bursitis    Thrombocytosis, unspecified    Calcific tendinitis of unspecified shoulder     Past Medical History:   Diagnosis Date    Chicken pox     Headache     Heart murmur     Hyperlipidemia     Hypertension     Sexually transmitted infection     Sleep apnea      Past Surgical History:     Past Surgical History:   Procedure Laterality Date    BREAST SURGERY      WISDOM TOOTH EXTRACTION       Allergy:     Allergies   Allergen Reactions    Atorvastatin Diarrhea and Nausea And Vomiting     Patient states tolerates BRAND only    Oxycodone      Medications:     Medications that the patient states to be currently taking   Medication Sig    metoprolol succinate 25 mg 24 hr tablet Take 1 tablet (25 mg total) by mouth two (2) times daily.    tirzepatide (MOUNJARO) 10 mg/0.5 mL SC injection INJECT 0.5 ML SUBCUTANEOUSLY ONCE WEEKLY    traMADol 50 mg tablet Take 1 tablet (50 mg total) by mouth every six (6) hours as needed. Max Daily Amount: 200 mg    [DISCONTINUED] lisinopril 20 mg tablet Take 1 tablet (20 mg total) by mouth daily.    [DISCONTINUED] mirabegron (MYRBETRIQ) 25 mg ER tablet Take 1 tablet (25 mg total) by mouth daily.    [DISCONTINUED] triamterene-hydroCHLOROthiazide 37.5-25 mg capsule Take 1 capsule by mouth daily.     Family history:     Family History   Problem Relation Age of Onset    Hypothyroidism Mother     Kidney disease Father     Hypothyroidism Sister             [x]  Family history reviewed and is non contributory  Social history:     Social History     Tobacco Use    Smoking status: Never    Smokeless tobacco: Never   Substance Use Topics    Alcohol use: Yes     Comment: Rarely, few times a year         ROS:  Constitutional: Denies malaise, fever, chills, sweats, fatigue or unintentional weight loss.    ENT: Denies nasal congestion, sneezing or sore throat. Denies hearing changes, ear pain, sinus pain or hoarseness.    Cardiovascular: Denies chest pain, palpitations or orthopnea.    Respiratory: Denies dyspnea, wheezing, cough or sputum production.    Gastrointestinal: Denies abdominal pain, acid reflux, nausea, vomiting, diarrhea or blood in stool.    Genitourinary: Denies dysuria, urgency or frequency.  Denies hematuria, incontinence or flank pain    Musculoskeletal: Denies joint pain or swelling. Denies muscle, back or neck pain.    Psychiatric: Denies delusions, hallucinations, depression or anxiety.      PHYSICAL EXAM   Vitals:       BP 116/75  ~ Pulse 76  ~ Temp 36.5 ?C (97.7 ?F) (Tympanic)  ~ Resp 15  ~ Ht 5' 2.25'' (1.581 m)  ~ Wt (!) 267 lb (121.1 kg)  ~ LMP 01/26/2023 (Approximate)  ~ BMI 48.44 kg/m?  Body mass index is 48.44 kg/m?Marland Kitchen    General appearance:   Well nourished, well developed in no acute distress.      Head and Face:  Normocephalic, atraumatic    Eyes:  PERRL, EOM intact, conjunctiva clear, no icterus.     ENT:   Ears: EAC's clear bilateral, TM's normal bilateral  Nose: nasal passages clear, normal turbinates, no polyps  Throat: oropharynx normal without erythema, no tonsillar hypertrophy or exudates.    Neck:  Supple, no lymphadenopathy, no bruits. Thyroid gland normal.      Respiratory:  CTABL, no retractions, no rales, rhonchi or wheezes. Normal effort.      Cardiovascular:  Normal rate and rhythm, normal heart sounds. No murmurs, rubs or gallops.      Gastrointestinal:   Soft, non-tender, non-distended, normal bowel sounds. No hepatosplenomegaly. No abdominal bruits.    Musculoskeletal:   5/5 strength bilateral in upper and lower extremities. No deficits.    Psychiatric:   Appropriate affect, normal mood.      Screening Questionnaires:           Reviewed PHQ-9 and GAD-7 results, no signs of clinical depression or anxiety disorder.       Reviewed DAST-10 and AUDIT-C results, no signs of drug or alcohol use disorder.    PHQ-9 Results      02/17/2023     4:47 PM   Depression Screening (Patient Health Questionnaire PHQ)   Little interest or pleasure in doing things Not at all   Feeling down, depressed, or hopeless Not at all   Trouble falling or staying asleep, or sleeping too much Not at all   Feeling tired or having little energy Not at all   Poor appetite or overeating Not at all   Feeling bad about yourself - or that you are a failure or have let yourself or your family down Not at all   Trouble concentrating on things, such as reading the newspaper or watching television Not at all   Moving or speaking so slowly Or being so fidgety or restless Not at all   Thoughts that you would be better off dead, or of hurting yourself in some way Not at all   Total Score 0        GAD-7 Results      02/17/2023     4:47 PM   GAD-7   Feeling nervous, anxious, or on edge Not at all   Not being able to stop or control worrying Not at  all   Worrying too much about different things Not at all   Trouble relaxing Not at all   Being so restless that is hard to sit still Not at all   Becoming easily annoyed or irritable Not at all   Feeling afraid, as if something awful might happen Not at all   Total Score 0       DAST-10 Results      02/17/2023     4:48 PM   DAST-10   In the last year, have you used drugs other than those required for medical reasons No   Total Score 0       AUDIT-C Results      02/17/2023     4:47 PM   AUDIT-C   In the past year, how often did you have a drink containing alcohol? Monthly or less   In the past year, how many drinks containing alcohol did you have on a typical day when you were drinking? 1 or 2   In the past year, how often did you have six or more drinks on one occasion? Never   Total Score 1           LABS/STUDIES     Lab Results   Component Value Date    WBC 7.25 02/14/2023    HGB 13.2 02/14/2023    HCT 41.4 02/14/2023    MCV 87.7 02/14/2023    PLT 472 (H) 02/14/2023     Lab Results   Component Value Date    CREAT 0.73 02/14/2023    BUN 13 02/14/2023    NA 140 02/14/2023    K 4.5 02/14/2023    CL 103 02/14/2023    CO2 23 02/14/2023    GLUCOSE 88 02/14/2023     Lab Results   Component Value Date    ALT 30 08/09/2022    AST 26 08/09/2022    ALKPHOS 86 08/09/2022    BILITOT 0.2 08/09/2022     Lab Results   Component Value Date    CHOL 193 03/30/2022    CHOLDLCAL 126 (H) 03/30/2022    CHOLHDL 44 (L) 03/30/2022    TRIGLY 114 03/30/2022    NOHDLCHOCAL 149 (H) 03/30/2022     Lab Results   Component Value Date    HGBA1C 6.3 (H) 02/14/2023     Lab Results   Component Value Date    TSH 2.0 03/30/2022    T4AUTO 1.10 03/30/2022     No results found for: ''SPECGRAVUR'', ''GLUCOSEUR'', ''BLDUR'', ''PROTUR''    XR chest pa+lat (2 views)    Result Date: 02/11/2023  IMPRESSION: No previous studies available for comparison. Cardiomediastinal contour unremarkable. No congestion, focal pneumonic consolidation, significant atelectatic opacity or pneumothorax. No pleural effusions. No acute osseous abnormality seen. Signed by: Sherolyn Buba   02/11/2023 5:59 PM    Echo adult transthoracic complete    Result Date: 12/28/2022   1. Normal left ventricular size.  2. Mild concentric left ventricular hypertrophy.  3. Left ventricular ejection fraction is approximately 65 to 70%.  4. Diastolic function is indeterminant due to mitral valve pathology.  5. 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 Mean gradient across mitral valve is 8.0 mmHg at a heart rate of 78 bpm.  6. Mild mitral valve regurgitation.  7. Upper normal left atrium in size.  8. Compared to prior study on 06/24/22, there are no significant changes. [id number] Roseanne Reno MD Electronically signed by [id number] Roseanne Reno MD on 12/28/2022  at 4:43:22 PM  Sonographer: Nonah Mattes   Final     US pelvis transabd and transvaginal    Result Date: 08/01/2022  IMPRESSION: Low-lying intrauterine device in the lower uterine segment. IArdeen Fillers, M.D., have reviewed this radiological study personally and I am in full agreement with the findings of the report presented here. Signed by: Ardeen Fillers   08/01/2022 2:18 PM    Echo adult transthoracic complete    Result Date: 06/27/2022   1. Technically difficult study.  2. Normal left ventricular size.  3. Mild septal left ventricular hypertrophy.  4. The calculated ejection fraction (Simpson's) is 62 %.  5. 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 The mean diastolic gradient across the mitral valve is 8.0 mmHg at a heart rate of 80 bpm. There is associated mild mitral regurgitation  6. Abnormal LV diastolic function.  7. Normal PA systolic pressure. Normal left atrial size  8. There are no prior studies on this patient for comparison purposes. [id number] Roseanne Reno MD Electronically signed by [id number] Roseanne Reno MD on 06/27/2022 at 9:57:44 AM  Sonographer: Jamse Belfast   Final                                      Assessment & Plan     1. Annual physical exam  - Lipid Panel; Future  - TSH with reflex FT4, FT3; Future  - Urinalysis w/Reflex to Culture; Future  - Hepatic Funct Panel; Future    2. Type 2 diabetes mellitus treated without insulin (HCC/RAF)  - tirzepatide (MOUNJARO) 10 mg/0.5 mL injection; Inject 0.5 mLs (10 mg total) under the skin once a week. Dispense: 2 mL; Refill: 2  - Referral to Endocrinology    3. Hypertension, essential  - triamterene-hydroCHLOROthiazide 37.5-25 mg capsule; Take 1 capsule by mouth daily.  Dispense: 90 capsule; Refill: 3  - lisinopril 20 mg tablet; Take 1 tablet (20 mg total) by mouth daily.  Dispense: 90 tablet; Refill: 3    4. Overactive bladder  - mirabegron (MYRBETRIQ) 25 mg ER tablet; Take 1 tablet (25 mg total) by mouth daily.  Dispense: 90 tablet; Refill: 3    5. Degeneration of intervertebral disc of lumbar region with discogenic back pain and lower extremity pain  - Referral to [facility], Physical Therapy    6. Left shoulder tendinitis  - Referral to [facility], Physical Therapy    7. Foot injury, left, sequela  - Referral to Podiatry          Author:  Octavia Heir, MD on 02/17/2023 at 5:19 PM

## 2023-02-20 ENCOUNTER — Ambulatory Visit: Payer: PRIVATE HEALTH INSURANCE

## 2023-02-23 NOTE — Progress Notes
Date:  02/28/2023    Name:  Jamie Cohen  MRN:   [id number]  DOB:   [birth year]  Age:   47 y.o.      Gwynneth Macleod, PA-C  [name], MD        Chief Complaint: Right hand pain     History: Jamie Cohen presents today for evaluation regarding the above chief complaint.Jamie Cohen is a 47 y.o. female who presents with a several month history of symptoms after no injury.     Patient reports that right hand post-op pain is well controlled.        Past Medical History and Review of Systems: The patient?s intake sheet, including past medical history, past surgical history, medicines, allergies, social and family history and review of systems was reviewed by myself with the patient today. There are no pertinent changes from the previous visit. Details are noted and documented in the patient?s file.    Past Medical History:   Diagnosis Date    Chicken pox     Headache     Heart murmur     Hyperlipidemia     Hypertension     Sexually transmitted infection     Sleep apnea        Social History     Socioeconomic History    Marital status: Other   Tobacco Use    Smoking status: Never    Smokeless tobacco: Never   Substance and Sexual Activity    Alcohol use: Yes     Comment: Rarely, few times a year    Drug use: Never    Sexual activity: Not Currently     Birth control/protection: IUD (non hormonal)   Other Topics Concern    Do you exercise at least a day, 3 or more days a week? No    Do you follow a special diet? No    Vegan? No    Vegetarian? No    Pescatarian? No    Lactose Free? No    Gluten Free? No    Omnivore? Yes             Physical Exam:   There were no vitals filed for this visit.     and   There were no vitals filed for this visit.        Constitutional:  Well appearing, no apparent distress.  The patient?s general appearance is well dressed well nourished. The body habitus is normal.    Psychiatric: Patient is alert and oriented to person, time and place, with a pleasant mood and affect. Eyes: Extraocular movements are intact, pupils are symmetric. No conjunctivitis or icterus is present; eyelids appear normal.     Skin: No lesions or rash noted.     Musculoskeletal and Neurological: Sensation intact to light touch. 2 point discrimination is less than 6mm in all digits unless otherwise noted.  Motor exam is 5/5 throughout bilateral upper extremities.     RUE:  The incision is healing well with no erythema, warmth, or drainage. She pulled her suture out of the thumb.Sutures intact to wrist.  Mild TTP, ecchymosis, and swelling to the palm  Able to make a fist  Motion and sensation intact distally  Brisk cap refill       Test Results:  EMG 05/25/2022: My independent review of the nerve conduction study shows there is bilateral carpal tunnel. These are consistent with the report stated below.   This is an abnormal electrodiagnostic examination.   There  is electrodiagnostic evidence suggestive of a right median neuropathy across the wrist, affecting the sensory and motor fibers. There is evidence of both demyelination and axon loss. These findings are consistent with a diagnosis of right carpal tunnel syndrome, and the electrodiagnostic abnormalities are moderate. There is no electrodiagnostic evidence of a right ulnar neuropathy across the wrist or elbow. There is no electrodiagnostic evidence of a right cervical radiculopathy.   Comparative testing to the contralateral limb suggests a mild left carpal syndrome. Correlate clinically.      Radiographs: PREVIOUS 3 views of the right hand which were ordered, obtained and evaluated by me here in the [facility] office today shows no evidence of fracture, arthritis or subluxation.     Impression:  Left carpal tunnel syndrome    Post-operative 02-16-23:  Right carpal tunnel release, open  Right thumb flexor tenosynovectomy at the level of the palm      Plan: Findings, diagnoses, prognosis, and treatment options were reviewed and discussed with the patient, with all questions answered.     Patient understands that they are progressing appropriately post-operatively.    Keep incisions clean and dry until completely healed, approximately 2-3 weeks after surgery. You may shower, but avoid submerging under water in pool/tub. Cover with clean, dry bandage when out of the house.    HEP reviewed    Activity modifications reviewed      PT/OT referral placed for R hand ROM      Next Appointment: 4 weeks  [x]  Reassessment of pain  [x]  No X-Ray  []  X-rays     I recommend a course of treatment as checked below:    []  Splinting/Bracing/Immobilization   []  Aspiration/Injection  []  Fracture Care  []  Decision for Surgery  []  Referral to PT/OT for formal program  []  Imaging study  []  Consultation for electrodiagnostic studies  []  Inflammatory labs  []  Prescription: Meloxicam 7.5mg  orally once daily for 30 days  []  Prescription: Norco 5/325mg  as needed orally once every 6 hours for pain  []  Prescription: Tramadol 50mg  as needed orally every 6 hours for pain  []  Prescription: Voltaren Gel       Gwynneth Macleod, PA-C   ORTHOPEDIC SURGERY  For Dr. Marda Stalker, Dr. [name], and Dr. Tawni Millers  The patient was examined and evaluated by Gwynneth Macleod, PA-C for the associated MD.  The evaluation and plan was reviewed and approved by the associated MD at the top of the chart.  02/28/2023

## 2023-02-28 ENCOUNTER — Ambulatory Visit: Payer: PRIVATE HEALTH INSURANCE

## 2023-02-28 DIAGNOSIS — M65941 Tenosynovitis of right hand: Secondary | ICD-10-CM

## 2023-02-28 DIAGNOSIS — G5601 Carpal tunnel syndrome, right upper limb: Secondary | ICD-10-CM

## 2023-03-06 DIAGNOSIS — M51362 Degeneration of intervertebral disc of lumbar region with discogenic back pain and lower extremity pain: Secondary | ICD-10-CM

## 2023-03-09 ENCOUNTER — Ambulatory Visit: Payer: PRIVATE HEALTH INSURANCE

## 2023-03-20 ENCOUNTER — Ambulatory Visit: Payer: PRIVATE HEALTH INSURANCE | Attending: Rehabilitative and Restorative Service Providers"

## 2023-03-20 NOTE — Treatment Summary
Daily Note/ Encounter  Patient: Perlie Ohlmann  MRN: [id number]  INSURANCE: Payor: [employer] / [employer] HMO / Plan: [phone] / Product Type: [place] [employer]MO /    DOB: 25-Nov-1975  Date of Service: 03/20/2023  Encounter Diagnosis:     ICD-10-CM    1. Degeneration of intervertebral disc of lumbar region with discogenic back pain and lower extremity pain  M51.362          Visit Count: 2   Subjective: ''I have some numbness in my legs when on my feet for a period of time and doing house chores''  Objective: SEE FLOWSHEET  [x]  Therapeutic Exercises: 30 minutes   [x]  Strengthening Exercises:   [x]  Range of Motion Exercises:   [x]  Flexibility Exercises:   []  Other:   [x]  Neuromuscular Re-education:  10 minutes   []  Proprioception/ Balance Training:  [x]  Postural Training:   []  Proprioceptive Neuromuscular Facilitation:   []  Other:   [x]  Therapeutic Activities:  10 minutes   [x]  ADL Training: educated in hep /activity modifications  [x]  Squat Progression  []  Stair Training:  []  Push/ Pull Activities:  []  Lifting Activities:  []  Gait Training:    []  Manual Therapy:  to  AFFECTED AREA for 10 minutes   []  Manual Traction:   []  Joint Mobilization:    []  MFR / Soft Tissue Mobilization:   []  Manual Stretching:    []  Manipulation:    []  Other:    Modalities:  []  Blood Flow Restriction  []  Ice Packs:  min to  []  Hot Packs:  min to   []  Paraffin Bath:  min to   []  E-Stim: Type __ setting __ min to ____  [] [place] [] Cont []  Pulsed  [] Phono  w/cm2   min at  MHz to   [] Traction: [] Cervical [] Pelvic  min [] Cont  [] Intermittent  lbs  [] Vasopneumatic   min to    []  Other Modality   Assessment: Added progression of core exercises/ supine 90/90 march without increase in symptoms. NO increase in pressure or symptoms with sit to stand, good transferance of core stability from table exercises. Continue to progress core/hip strength as able  Plan:   [x] Progress per treatment plan    [] Re-evaluate [] DME Provided (if any):  Total Time in Clinic: 50 minutes

## 2023-03-24 ENCOUNTER — Ambulatory Visit: Payer: PRIVATE HEALTH INSURANCE

## 2023-03-31 ENCOUNTER — Ambulatory Visit: Payer: PRIVATE HEALTH INSURANCE | Attending: Rehabilitative and Restorative Service Providers"

## 2023-04-03 ENCOUNTER — Ambulatory Visit: Payer: PRIVATE HEALTH INSURANCE | Attending: Medical

## 2023-04-04 ENCOUNTER — Ambulatory Visit: Payer: PRIVATE HEALTH INSURANCE | Attending: Rehabilitative and Restorative Service Providers"

## 2023-04-04 DIAGNOSIS — M65941 Tenosynovitis of right hand: Secondary | ICD-10-CM

## 2023-04-04 DIAGNOSIS — G5601 Carpal tunnel syndrome, right upper limb: Secondary | ICD-10-CM

## 2023-04-04 NOTE — Treatment Summary
Daily Note/ Encounter  Patient: Jamie Cohen  MRN: [id number]  INSURANCE: Payor: [employer] / [employer]MO / Plan: [phone] / Product Type: [place] [employer]MO /    DOB: 1975-03-16  Date of Service: 04/04/2023  Encounter Diagnosis:   No diagnosis found.     Visit Count: Visit count could not be calculated. Make sure you are using a visit which is associated with an episode.  Subjective: See eval. Patient reports [x]  pain, weakness, paraesthesias, and stiffness, which impact functional use of right hand in daily activities.   Objective:   Pain:   {Pain Characteristics:26662}    [x]  Therapeutic Exercises:  minutes   []  Strengthening Exercises:   []  Range of Motion Exercises:   []  Flexibility Exercises:   [x]  Other: HEP  []  Neuromuscular Re-education:   minutes   []  Proprioception/ Balance Training:  []  Postural Training:   []  Proprioceptive Neuromuscular Facilitation:   []  Other:   []  Therapeutic Activities:   minutes   []  ADL Training:   []  Activity Modification:  []  Push/ Pull Activities:  []  Lifting Activities:  []  Manual Therapy:  to   for minutes   []  Manual Traction:   []  Joint Mobilization:    []  MFR / Soft Tissue Mobilization:   []  Manual Stretching:    []  Manipulation:    []  Other:   []  Orthotic Management and Training Initial or Subsequent: minutes  []  Donning and Doffing Orthosis:  []  Training in Functional Use of Orthosis:    []  Orthotic modification or fabrication:    Modalities:  []  Blood Flow Restriction  []  Ice Packs:  min to  []  Hot Packs:  min to   []  Paraffin Bath:  min to   []  E-Stim: Type __ setting __ min to ____  [] [place] [] Cont []  Pulsed  [] Phono  w/cm2   at  to   [] Traction: [] Cervical [] Pelvic  min [] Cont  [] Intermittent  lbs  [] Vasopneumatic   min to    []  Other Modality   Assessment: Patient tolerated treatment with no adverse reactions to modalities.     Therapist instructed patient in home exercise program including modality use, scar management, desensitization techniques, edema management  Handouts with visuals and written instructions provided for reference.  Plan: Continue plan of care to address patient's pain, stiffness, weakness, scar management, swelling, and decreased functional use of right hand in activities of dailiy living and instrumental activities of daily living.  [x] Progress per treatment plan    [] Re-evaluate [] DME Provided (if any):  Total Time in Clinic:  minutes

## 2023-04-04 NOTE — Treatment Summary
Daily Note/ Encounter  Patient: Jamie Cohen  MRN: [id number]  INSURANCE: Payor: [employer] / [employer]MO / Plan: [phone] / Product Type:  HMO /    DOB: December 05, 1975  Date of Service: 04/04/2023  Encounter Diagnosis:     ICD-10-CM    1. Carpal tunnel syndrome, right  G56.01       2. Tenosynovitis of right hand  M65.941          Visit Count: Visit count could not be calculated. Make sure you are using a visit which is associated with an episode.  Subjective: See eval. Patient reports [x]  pain, weakness, paraesthesias, and stiffness, which impact functional use of right hand in daily activities.   Objective:   Pain:   {Pain Characteristics:26662}    [x]  Therapeutic Exercises:  minutes   []  Strengthening Exercises:   []  Range of Motion Exercises:   []  Flexibility Exercises:   [x]  Other: HEP  []  Neuromuscular Re-education:   minutes   []  Proprioception/ Balance Training:  []  Postural Training:   []  Proprioceptive Neuromuscular Facilitation:   []  Other:   []  Therapeutic Activities:   minutes   []  ADL Training:   []  Activity Modification:  []  Push/ Pull Activities:  []  Lifting Activities:  []  Manual Therapy:  to   for minutes   []  Manual Traction:   []  Joint Mobilization:    []  MFR / Soft Tissue Mobilization:   []  Manual Stretching:    []  Manipulation:    []  Other:   []  Orthotic Management and Training Initial or Subsequent: minutes  []  Donning and Doffing Orthosis:  []  Training in Functional Use of Orthosis:    []  Orthotic modification or fabrication:    Modalities:  []  Blood Flow Restriction  []  Ice Packs:  min to  []  Hot Packs:  min to   []  Paraffin Bath:  min to   []  E-Stim: Type __ setting __ min to ____  [] [place] [] Cont []  Pulsed  [] Phono  w/cm2   at  to   [] Traction: [] Cervical [] Pelvic  min [] Cont  [] Intermittent  lbs  [] Vasopneumatic   min to    []  Other Modality   Assessment: Patient tolerated treatment with no adverse reactions to modalities.     Therapist instructed patient in home exercise program including modality use, scar management, desensitization techniques, edema management  Handouts with visuals and written instructions provided for reference.  Plan: Continue plan of care to address patient's pain, stiffness, weakness, scar management, swelling, and decreased functional use of right hand in activities of dailiy living and instrumental activities of daily living.  [x] Progress per treatment plan    [] Re-evaluate [] DME Provided (if any):  Total Time in Clinic:  minutes

## 2023-04-04 NOTE — Progress Notes
OCCUPATIONAL THERAPY EVALUATION     PATIENT: Jamie Cohen  GENDER: female  AGE: 48 y.o.  DOB: 1975-03-27  MRN: [id number]    REFERRING PHYSICIAN: Audie Box., PA-C    DATE OF ONSET:      DIAGNOSIS:    ICD-10-CM    1. Carpal tunnel syndrome, right  G56.01       2. Tenosynovitis of right hand  M65.941           Subjective:   CHIEF COMPLAINT:  No chief complaint on file.        Per chart notes dated 02/28/23:    Impression:  Left carpal tunnel syndrome  Post-operative 02-16-23:  Right carpal tunnel release, open  Right thumb flexor tenosynovectomy at the level of the palm      HISTORY OF PRESENT ILLNESS / INJURY: ***        Hand dominance:  []  Left  []  Right []  Ambidextrous    Affected extremity:  []  Left  [x]  Right []  Bilateral    PRIOR INTERVENTIONS:  Injections and Surgery    BARRIERS TO LEARNING:                             History:   PROBLEM SUMMARY LIST:   Patient Active Problem List   Diagnosis    Calcific tendonitis of left shoulder    Subscapular bursitis    Thrombocytosis, unspecified    Calcific tendinitis of unspecified shoulder    Degeneration of intervertebral disc of lumbar region with discogenic back pain and lower extremity pain    Carpal tunnel syndrome, right    Tenosynovitis of right hand       SURGICAL HISTORY:  Past Surgical History:   Procedure Laterality Date    BREAST SURGERY      WISDOM TOOTH EXTRACTION          PERTINENT DIAGNOSTIC TESTS:      EMG 05/25/2022: My independent review of the nerve conduction study shows there is bilateral carpal tunnel. These are consistent with the report stated below.   This is an abnormal electrodiagnostic examination.   There is electrodiagnostic evidence suggestive of a right median neuropathy across the wrist, affecting the sensory and motor fibers. There is evidence of both demyelination and axon loss. These findings are consistent with a diagnosis of right carpal tunnel syndrome, and the electrodiagnostic abnormalities are moderate. There is no electrodiagnostic evidence of a right ulnar neuropathy across the wrist or elbow. There is no electrodiagnostic evidence of a right cervical radiculopathy.   Comparative testing to the contralateral limb suggests a mild left carpal syndrome. Correlate clinically.      Radiographs: PREVIOUS 3 views of the right hand which were ordered, obtained and evaluated by me here in the [facility] office today shows no evidence of fracture, arthritis or subluxation.         PRECAUTIONS:  None    RECENT INFECTIONS:  No    PAIN:   Location: right thumb, wrist  Duration: {Duration:26489}  Description: {Descript:26490}  Current Pain: {#:26501}  Pain at Best: {#:26501}  Pain at Worst: {#:26501}  Aggravating Factors: {Aggravating:26492}  Alleviating Factors: {Alleviating:26493}  Pain Comments: ***    OCCUPATION: ***    Hobbies/Interest/Sports: ***    PATIENT'S GOAL: To improve pain with activity    Prior Level of Function:  Independent in household management, self-care, and work related or extracurricular tasks.     Current Level of  Function:  Mild/Moderate/Severe difficulties in household management, self-care, and work related or extracurricular tasks.      LIVING SITUATION:  {Living Situation:26494}    PATIENT OWNED EQUIPMENT:  {AMB_OTEQUIP_[place]:23300}    Objective:       Range Of Motion:  {RHB EVAL; OT HAND ROM:28491}    Strength Measurements:  {RHB EVAL; OT HAND STRENGTH MEASUREMENTS:28498}      Scar/Wound Appearance:  Healed, with no signs of infection observed or reported      Palpation:  ***      Outcome Measures:  {AMB RHB OUTCOME MEASURES [place]:27131}  {SCOI Therapy Outcome Measure (Optional):42115}      Problem List:  {AMB RHB OT HAND PROBLEM [place]:27155}  {Functional Limitations:27156}    Goals:  Short Term Goals:  {functional goals:43127}    Long Term Goals:  {goals:28531}  {MISC; OT UE HEP GOALS:[phone]}        Plan:     ASSESSMENT:  48 y.o. year old female presents with referral diagnosis of     ICD-10-CM    1. Carpal tunnel syndrome, right  G56.01       2. Tenosynovitis of right hand  M65.941        (primary encounter diagnosis) since  .    Symptoms are in the {Stage:23465} phase with {Mild/Moderate/Severe:29170} irritability.     Patient presents to Occupational Therapy with primary impairment(s) of {RIGHT LEFT BILATERAL:24330}  {Hand Parts:35054} include {otimpairments:35055} which are limiting the ability to .{otdailyactivitieslimitation:35056} Please refer to problem list. It appears that patient would benefit from implementation of occupational therapy at this time to address these deficits. Patient was involved in goal setting and program planning. The patient requires skilled Occupational Therapy that can be safely and effective performed only by a qualified therapist to address the above deficits and functional limitations.      1. Occupational profile, medical and therapy history level: {History Level:29748}  2. Assessment of Occupational Performance: {Occupational Performance :29749}  3. Co-morbidities: {None/wild card:25134::''None''}  4. Assessment modification and need for assistance to complete assessment: {Assessment:29750}  5. Consideration of treatment options: {Treatment Options:29751}  6. Level of clinical decision making: {Clinical Decision:29752}    Plan of care to include:  [x]  Modalities  [x]  Manual therapy  [x]  Therapeutic exercise  [x]  Range of motion  [x]  Strengthening  [x]  Functional /Occupational activities  [x]  Orthotic fabrication   [x]  Scar management  [x]  Edema management  []  Wound care  [x]  Neuromuscular Re-education  [x]  Body mechanics/ Joint protection principles    Duration: *** times a week for *** weeks    REHAB POTENTIAL: {select:26497}    OBSTACLES TO REHABILITATION/CO-MORBIDITIES THAT MAY AFFECT GOALS AND TREATMENT PLAN: ***      Patient was seen for a total of *** minutes of treatment time.   *** minutes was in services represented by timed CPT codes.    Clerance Lav, MA, OTR/L, CHT, ASTYM Certified

## 2023-04-04 NOTE — Progress Notes
OCCUPATIONAL THERAPY EVALUATION     PATIENT: Jamie Cohen  GENDER: female  AGE: 48 y.o.  DOB: 05-26-1975  MRN: [id number]    REFERRING PHYSICIAN: Audie Box., PA-C    DATE OF ONSET:      DIAGNOSIS:  No diagnosis found.      Subjective:   CHIEF COMPLAINT:  No chief complaint on file.        Per chart notes dated 02/28/23:    Impression:  Left carpal tunnel syndrome  Post-operative 02-16-23:  Right carpal tunnel release, open  Right thumb flexor tenosynovectomy at the level of the palm      HISTORY OF PRESENT ILLNESS / INJURY: ***        Hand dominance:  []  Left  []  Right []  Ambidextrous    Affected extremity:  []  Left  [x]  Right []  Bilateral    PRIOR INTERVENTIONS:  Injections and Surgery    BARRIERS TO LEARNING:                             History:   PROBLEM SUMMARY LIST:   Patient Active Problem List   Diagnosis    Calcific tendonitis of left shoulder    Subscapular bursitis    Thrombocytosis, unspecified    Calcific tendinitis of unspecified shoulder    Degeneration of intervertebral disc of lumbar region with discogenic back pain and lower extremity pain    Carpal tunnel syndrome, right    Tenosynovitis of right hand       SURGICAL HISTORY:  Past Surgical History:   Procedure Laterality Date    BREAST SURGERY      WISDOM TOOTH EXTRACTION          PERTINENT DIAGNOSTIC TESTS:      EMG 05/25/2022: My independent review of the nerve conduction study shows there is bilateral carpal tunnel. These are consistent with the report stated below.   This is an abnormal electrodiagnostic examination.   There is electrodiagnostic evidence suggestive of a right median neuropathy across the wrist, affecting the sensory and motor fibers. There is evidence of both demyelination and axon loss. These findings are consistent with a diagnosis of right carpal tunnel syndrome, and the electrodiagnostic abnormalities are moderate. There is no electrodiagnostic evidence of a right ulnar neuropathy across the wrist or elbow. There is no electrodiagnostic evidence of a right cervical radiculopathy.   Comparative testing to the contralateral limb suggests a mild left carpal syndrome. Correlate clinically.      Radiographs: PREVIOUS 3 views of the right hand which were ordered, obtained and evaluated by me here in the [facility] office today shows no evidence of fracture, arthritis or subluxation.         PRECAUTIONS:  None    RECENT INFECTIONS:  No    PAIN:   Location: right thumb, wrist  Duration: {Duration:26489}  Description: {Descript:26490}  Current Pain: {#:26501}  Pain at Best: 2/10  Pain at Worst: 2/10  Aggravating Factors: {Aggravating:26492}  Alleviating Factors: Medication and Massage  Pain Comments: ***    OCCUPATION: ***    Hobbies/Interest/Sports: ***    PATIENT'S GOAL: To improve pain with activity    Prior Level of Function:  Independent in household management, self-care, and work related or extracurricular tasks.     Current Level of Function:  Mild difficulties in household management, self-care, and work related or extracurricular tasks.      LIVING SITUATION:  Lives alone  PATIENT OWNED EQUIPMENT:  None    Objective:       Range Of Motion:  {RHB EVAL; OT HAND ROM:28491}    Strength Measurements:  {RHB EVAL; OT HAND STRENGTH MEASUREMENTS:28498}      Scar/Wound Appearance:  Healed, with no signs of infection observed or reported      Palpation:  ***      Outcome Measures:  Quick Dash:       04/04/2023   Quick Dash Score   QuickDASH Score 13.64             Problem List:  {AMB RHB OT HAND PROBLEM [place]:27155}  {Functional Limitations:27156}    Goals:  Short Term Goals:  {functional goals:43127}    Long Term Goals:  {goals:28531}  {MISC; OT UE HEP GOALS:[phone]}        Plan:     ASSESSMENT:  48 y.o. year old female presents with referral diagnosis of   No diagnosis found.   (primary encounter diagnosis) since  .    Symptoms are in the {Stage:23465} phase with {Mild/Moderate/Severe:29170} irritability.     Patient presents to Occupational Therapy with primary impairment(s) of {RIGHT LEFT BILATERAL:24330}  {Hand Parts:35054} include {otimpairments:35055} which are limiting the ability to .{otdailyactivitieslimitation:35056} Please refer to problem list. It appears that patient would benefit from implementation of occupational therapy at this time to address these deficits. Patient was involved in goal setting and program planning. The patient requires skilled Occupational Therapy that can be safely and effective performed only by a qualified therapist to address the above deficits and functional limitations.      1. Occupational profile, medical and therapy history level: {History Level:29748}  2. Assessment of Occupational Performance: {Occupational Performance :29749}  3. Co-morbidities: {None/wild card:25134::''None''}  4. Assessment modification and need for assistance to complete assessment: {Assessment:29750}  5. Consideration of treatment options: {Treatment Options:29751}  6. Level of clinical decision making: {Clinical Decision:29752}    Plan of care to include:  [x]  Modalities  [x]  Manual therapy  [x]  Therapeutic exercise  [x]  Range of motion  [x]  Strengthening  [x]  Functional /Occupational activities  [x]  Orthotic fabrication   [x]  Scar management  [x]  Edema management  []  Wound care  [x]  Neuromuscular Re-education  [x]  Body mechanics/ Joint protection principles    Duration: *** times a week for *** weeks    REHAB POTENTIAL: {select:26497}    OBSTACLES TO REHABILITATION/CO-MORBIDITIES THAT MAY AFFECT GOALS AND TREATMENT PLAN: ***      Patient was seen for a total of *** minutes of treatment time.   *** minutes was in services represented by timed CPT codes.    Clerance Lav, MA, OTR/L, CHT, ASTYM Certified

## 2023-04-05 ENCOUNTER — Ambulatory Visit: Payer: PRIVATE HEALTH INSURANCE | Attending: Medical

## 2023-04-05 NOTE — Progress Notes
Date:  04/05/2023    Name:  Jamie Cohen  MRN:   [id number]  DOB:   Mar 24, 1975  Age:   48 y.o.      Jamie A.  [name], MD  [name], PA-C    The patient was seen in our office by [name], PA-C for [name], MD.      Chief Complaint: Right hand pain     History: [facility] presents today for evaluation regarding the above chief complaint. Jamie Cohen is a 48 y.o. female who presents with a several month history of symptoms after no injury.     The pain is localized in the right hand. She notes swelling and notes numbness. Moreover, the symptoms are exacerbated by use and are alleviated by rest.      Past Medical History and Review of Systems: The patient?s intake sheet, including past medical history, past surgical history, medicines, allergies, social and family history and review of systems was reviewed by myself with the patient today. There are no pertinent changes from the previous visit. Details are noted and documented in the patient?s file.    Past Medical History:   Diagnosis Date    Chicken pox     Headache     Heart murmur     Hyperlipidemia     Hypertension     Sexually transmitted infection     Sleep apnea        Social History     Socioeconomic History    Marital status: Other   Tobacco Use    Smoking status: Never    Smokeless tobacco: Never   Substance and Sexual Activity    Alcohol use: Yes     Comment: Rarely, few times a year    Drug use: Never    Sexual activity: Not Currently     Birth control/protection: IUD (non hormonal)   Other Topics Concern    Do you exercise at least a day, 3 or more days a week? No    Do you follow a special diet? No    Vegan? No    Vegetarian? No    Pescatarian? No    Lactose Free? No    Gluten Free? No    Omnivore? Yes             Physical Exam:   There were no vitals filed for this visit.     and   There were no vitals filed for this visit.        Constitutional:  Well appearing, no apparent distress.  The patient?s general appearance is well dressed well nourished. The body habitus is normal.    Psychiatric: Patient is alert and oriented to person, time and place, with a pleasant mood and affect.      Eyes: Extraocular movements are intact, pupils are symmetric. No conjunctivitis or icterus is present; eyelids appear normal.     Skin: No lesions or rash noted.     Musculoskeletal and Neurological: Sensation intact to light touch. 2 point discrimination is less than 6mm in all digits unless otherwise noted.  Motor exam is 5/5 throughout bilateral upper extremities.   Exam right hand: Well healed incision. Mild diffuse swelling and tenderness around incision.No obvious signs of infection. Neurovascularly intact.       Test Results:  EMG 05/25/2022: My independent review of the nerve conduction study shows there is bilateral carpal tunnel. These are consistent with the report stated below.   This is an  abnormal electrodiagnostic examination.   There is electrodiagnostic evidence suggestive of a right median neuropathy across the wrist, affecting the sensory and motor fibers. There is evidence of both demyelination and axon loss. These findings are consistent with a diagnosis of right carpal tunnel syndrome, and the electrodiagnostic abnormalities are moderate. There is no electrodiagnostic evidence of a right ulnar neuropathy across the wrist or elbow. There is no electrodiagnostic evidence of a right cervical radiculopathy.   Comparative testing to the contralateral limb suggests a mild left carpal syndrome. Correlate clinically.      Radiographs: PREVIOUS 3 views of the right hand which were ordered, obtained and evaluated by me here in the [facility] office today shows no evidence of fracture, arthritis or subluxation.     Impression:  Left carpal tunnel syndrome    Postoperative 02/16/2023:  Right carpal tunnel release, open  Right thumb flexor tenosynovectomy at the level of the palm     Plan: Findings, diagnoses, prognosis, and treatment options were reviewed and discussed with the patient, with all questions answered.     Advised to use the hand without restriction to best of their capability  Continue with daily ROM and strengthening activities.   Continue with therapy as scheduled.    Next Appointment: 4 weeks CL  [x]  Reassessment of pain  [x]  No X-Ray  []  X-rays     I recommend a course of treatment as checked below:    []  Splinting/Bracing/Immobilization   []  Aspiration/Injection  []  Fracture Care  []  Decision for Surgery  []  Referral to PT/OT for formal program  []  Imaging study  []  Consultation for electrodiagnostic studies  []  Inflammatory labs  []  Prescription: Meloxicam 7.5mg  orally once daily for 30 days  []  Prescription: Norco 5/325mg  as needed orally once every 6 hours for pain  []  Prescription: Tramadol 50mg  as needed orally every 6 hours for pain  []  Prescription: Voltaren Gel     The patient was examined and evaluated by [name], PA-C for [name], MD.  The evaluation and plan was reviewed and approved by [name], MD.

## 2023-04-06 ENCOUNTER — Ambulatory Visit: Payer: BC Managed Care – HMO | Attending: Rehabilitative and Restorative Service Providers"

## 2023-04-06 NOTE — Treatment Summary
Daily Note/ Encounter  Patient: Jamie Cohen  MRN: [id number]  INSURANCE: Payor: [employer] / [employer] HMO / Plan: [phone] / Product Type: [place] [employer]MO /    DOB: 1975/11/23  Date of Service: 04/06/2023  Encounter Diagnosis:     ICD-10-CM    1. Degeneration of intervertebral disc of lumbar region with discogenic back pain and lower extremity pain  M51.362          Visit Count: 3   Subjective: ''No changes''  Objective: SEE FLOWSHEET  [x]  Therapeutic Exercises: 30 minutes   [x]  Strengthening Exercises:   [x]  Range of Motion Exercises:   [x]  Flexibility Exercises:   []  Other:   [x]  Neuromuscular Re-education:  10 minutes   []  Proprioception/ Balance Training:  [x]  Postural Training:   []  Proprioceptive Neuromuscular Facilitation:   []  Other:   [x]  Therapeutic Activities:  10 minutes   [x]  ADL Training: educated in hep /activity modifications  [x]  Squat Progression  []  Stair Training:  []  Push/ Pull Activities:  []  Lifting Activities:  []  Gait Training:    []  Manual Therapy:  to  AFFECTED AREA for 10 minutes   []  Manual Traction:   []  Joint Mobilization:    []  MFR / Soft Tissue Mobilization:   []  Manual Stretching:    []  Manipulation:    []  Other:    Modalities:  []  Blood Flow Restriction  []  Ice Packs:  min to  []  Hot Packs:  min to   []  Paraffin Bath:  min to   []  E-Stim: Type __ setting __ min to ____  [] [place] [] Cont []  Pulsed  [] Phono  w/cm2   min at  MHz to   [] Traction: [] Cervical [] Pelvic  min [] Cont  [] Intermittent  lbs  [] Vasopneumatic   min to    []  Other Modality   Assessment: Progressed supine march to 90 /90 heel taps. Added side steps with band. No back discomfort today during session and treatment well tolerated. Continue to progress strengthening as able.  Plan:   [x] Progress per treatment plan    [] Re-evaluate [] DME Provided (if any):  Total Time in Clinic: 50 minutes

## 2023-04-07 ENCOUNTER — Ambulatory Visit: Payer: BC Managed Care – HMO | Attending: Rehabilitative and Restorative Service Providers"

## 2023-04-13 ENCOUNTER — Ambulatory Visit: Payer: BC Managed Care – HMO

## 2023-04-13 DIAGNOSIS — Z01419 Encounter for gynecological examination (general) (routine) without abnormal findings: Secondary | ICD-10-CM

## 2023-04-13 DIAGNOSIS — B009 Herpesviral infection, unspecified: Secondary | ICD-10-CM

## 2023-04-13 DIAGNOSIS — Z1231 Encounter for screening mammogram for malignant neoplasm of breast: Secondary | ICD-10-CM

## 2023-04-13 DIAGNOSIS — Z975 Presence of (intrauterine) contraceptive device: Secondary | ICD-10-CM

## 2023-04-13 DIAGNOSIS — L853 Xerosis cutis: Secondary | ICD-10-CM

## 2023-04-13 DIAGNOSIS — N3946 Mixed incontinence: Secondary | ICD-10-CM

## 2023-04-13 MED ORDER — VALACYCLOVIR HCL 1 G PO TABS
1000 mg | ORAL_TABLET | Freq: Two times a day (BID) | ORAL | 0 refills | Status: AC
Start: 2023-04-13 — End: ?

## 2023-04-13 NOTE — H&P
[facility]   Primary Care Well Woman Note        PCP:  Edd Arbour, MD    Subjective:     CC:   Chief Complaint   Patient presents with    Gynecologic Exam     PAP and Breast exam    Referral / Auth     Mammo       Patient Active Problem List   Diagnosis    Calcific tendonitis of left shoulder    Subscapular bursitis    Thrombocytosis, unspecified    Calcific tendinitis of unspecified shoulder    Degeneration of intervertebral disc of lumbar region with discogenic back pain and lower extremity pain    Carpal tunnel syndrome, right    Tenosynovitis of right hand         HPI:  Jamie Cohen is a 48 y.o. female  for annual routine Pap and checkup.    Takes meds overactive bladder -- myrbetriq       Mammo and pap Aug 2023   No LMP recorded. Patient is postmenopausal.    ROS:  Feeling well. No dyspnea or chest pain on exertion.  No abdominal pain, change in bowel habits, black or bloody stools.  No urinary tract symptoms. No neurological complaints.    GYN ROS: no breast pain or new or enlarging lumps on self exam, no vaginal bleeding, no discharge or pelvic pain.     Obstetric History    Menstrual History: irregular, IUD in place - paragaurd - 2023  Pregnancy History: G1P0010   Family history breast, cervical, and ovarian CA: No  Birth Control: paragaurd         Review of Systems  A 14 point review of systems is negative except as described above.    Allergies   Allergen Reactions    Atorvastatin Diarrhea and Nausea And Vomiting     Patient states tolerates BRAND only    Oxycodone        Medications and Supplements  Medications that the patient states to be currently taking   Medication Sig    lisinopril 20 mg tablet Take 1 tablet (20 mg total) by mouth daily.    metoprolol succinate 25 mg 24 hr tablet Take 1 tablet (25 mg total) by mouth two (2) times daily.    mirabegron (MYRBETRIQ) 25 mg ER tablet Take 1 tablet (25 mg total) by mouth daily.    tirzepatide (MOUNJARO) 10 mg/0.5 mL injection Inject 0.5 mLs (10 mg total) under the skin once a week.    traMADol 50 mg tablet Take 1 tablet (50 mg total) by mouth every six (6) hours as needed. Max Daily Amount: 200 mg    triamterene-hydroCHLOROthiazide 37.5-25 mg capsule Take 1 capsule by mouth daily.         Objective:     Physical Exam  BP 103/66  ~ Pulse 78  ~ Temp 37.4 ?C (99.4 ?F) (Oral)  ~ Resp 18  ~ Ht 5' 2.25'' (1.581 m)  ~ Wt (!) 267 lb 9.6 oz (121.4 kg)  ~ LMP 03/13/2023  ~ BMI 48.55 kg/m?     (Blank box means not examined)    (Check if examined)        (Check if normal)                           (Note positive findings)  [x]  Breast exam [x]  No asymmetry, mass, discharge,  or tenderness      [x]  GU Female [x]  Normal EGBUS, vagina, cervix, urethral meatus  [x]  BME: uterus nontender, mobile, no adenexal masses or tenderness  [x]  PAP obtained    []  Digital rectal exam []  Normal sphincter tone, no hemorrhoids, tags or fissures  []  Normal anus and perineum      General: alert, well appearing, and in no distress.  Abdomen: soft, nontender, nondistended, no masses or organomegaly  Skin: normal coloration and turgor, no rashes, no suspicious skin lesions noted.    Labs    No Cervical Cancer Screening results to display.    No results found.  ***    Studies  ***        Assessment:   ASSESSMENT:   Problem List Items Addressed This Visit    None        PLAN:   mammogram  pap smear  counseled on breast self exam, mammography screening, menopause, osteoporosis, and adequate intake of calcium and vitamin D  return annually or prn    The above plan of care, diagnosis, orders, and follow-up were discussed with the patient.  Questions related to this recommended plan of care were answered.    Quintan Saldivar P. Milbert Coulter, MD  04/13/2023 at 9:08 AM

## 2023-04-14 ENCOUNTER — Ambulatory Visit: Payer: PRIVATE HEALTH INSURANCE | Attending: Rehabilitative and Restorative Service Providers"

## 2023-04-20 LAB — HPV DNA PCR: HPV TYPE 16: NEGATIVE

## 2023-04-21 LAB — Liquid-based pap smear: LAB AP GYN INTERPRETATION: NEGATIVE

## 2023-04-27 NOTE — Progress Notes
 I'm glad to report your pap smear results are benign(normal)

## 2023-05-04 ENCOUNTER — Ambulatory Visit: Payer: PRIVATE HEALTH INSURANCE

## 2023-05-04 DIAGNOSIS — E1169 Type 2 diabetes mellitus with other specified complication: Secondary | ICD-10-CM

## 2023-05-04 DIAGNOSIS — G4733 Obstructive sleep apnea (adult) (pediatric): Secondary | ICD-10-CM

## 2023-05-04 DIAGNOSIS — I10 Essential (primary) hypertension: Secondary | ICD-10-CM

## 2023-05-04 DIAGNOSIS — E119 Type 2 diabetes mellitus without complications: Secondary | ICD-10-CM

## 2023-05-04 DIAGNOSIS — E66813 Class 3 severe obesity with serious comorbidity and body mass index (BMI) of 45.0 to 49.9 in adult, unspecified obesity type (HCC/RAF): Secondary | ICD-10-CM

## 2023-05-04 DIAGNOSIS — E785 Hyperlipidemia, unspecified: Secondary | ICD-10-CM

## 2023-05-04 DIAGNOSIS — Z6841 Body Mass Index (BMI) 40.0 and over, adult: Secondary | ICD-10-CM

## 2023-05-04 MED ORDER — MOUNJARO 12.5 MG/0.5ML SC SOAJ
12.5 mg | SUBCUTANEOUS | 3 refills | Status: AC
Start: 2023-05-04 — End: ?

## 2023-05-04 NOTE — Progress Notes
 INITIAL ENDOCRINOLOGY OUTPATIENT CONSULTATION      Patient name:  Jamie Cohen  MRN:  [id number]  DOB:  1976-02-24  Date of service:  05/04/2023    Referring practitioner: Octavia Heir, MD  Primary care provider: Octavia Heir, MD    Reason for History of Present Illness / Chief Complaint: Diabetes mellitus      History of Present Illness:   Jamie Cohen is a 48 y.o. female who  has a past medical history of Chicken pox, Headache, Heart murmur, Hyperlipidemia, Hypertension, Sexually transmitted infection, and Sleep apnea. presents to Endocrinology Clinic for management of diabetes mellitus type 2.     Just moved from [place]     #T2DM  Diagnosed on routine labs ~2021. Previously managed by PCP. Initially tried metformin but did not improve her A1c during prediabetes stage. Started Ozempic ~2020, moved the [place] early 2024, dose increased to 10mg  weekly now, no significant weight loss so far. No  history of hospitalizations for diabetes. No fam hx of DM. Denies personal history of pancreatitis, family history of pancreatic cancer, family history of thyroid cancer, and family history of MEN2. No history of frequent UTI or osteoporosis     BP at home 110/70-80. On lisinopril and triameterene-hydrochlorothiazide     Could not tolerate generic atorvastatin d/t stomach issues. Waiting for brand name atorvastatin - PCP office working on this     Baseline weight 180-190 lb 4-5 yrs ago. 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 Max 280 lb before she moved. Weight did drop with power lifting a few years ago. Now 260 lb 04/2023. Denies purple abd striae, easy bruising, skin thinning, increase in hat/ring/shoe size. No family hx of adrenal/pituitary issue. Sister w/ Grave's disease     Diabetes Complications &  Associated Co-morbidites Yes No Treatment/Details, if any Last exam and result    Retinopathy  []    [x]    Ophthalmology: 01/2023 negative   Nephropathy  []    [x]   [x]  ACE-I/ARB Lab Results   Component Value Date    ALBCREATUR 16.9 04/01/2022    CREAT 0.73 02/14/2023    GFRESTCKDEPI >89 02/14/2023        Neuropathy Autonomic                      Peripheral  []    []     []    [x]    Podiatry:    Diabetes foot exam:      Foot ulcer  []    [x]       Coronary artery disease  []    [x]  []  Statin: none currently  Lab Results   Component Value Date    CHOL 258 04/13/2023    CHOLHDL 43 (L) 04/13/2023    CHOLDLCAL 182 (H) 04/13/2023    TRIGLY 164 (H) 04/13/2023       Strokes  []    [x]      Peripheral Vascular Disease  []    [x]      DKA/HHS  []    [x]      Hypoglycemia Unawareness  []    []      Sleep apnea  []    [x]  Mild OSA 4-5 yrs ago, gained weight since then       Lab Results   Component Value Date    HGBA1C 6.3 (H) 02/14/2023    HGBA1C 6.5 (H) 08/02/2022    HGBA1C 6.4 (H) 03/30/2022     Current DM medications:  Mounjaro 10mg  weekly since summer 2024- no GI side  effect    Glucose review:  None  POC 89 today     Exercise:   Has a desk job. Walking dog - briskly, daily x 30 min +   No resistance training   1-2x per week walks during lunch break     Nutrition:  Eats a large meal towards the end of the day 8-9pm and goes to bed after  < 2000 kcal per day  L chicken breast, 1 cup white rice, veggie  D sushi   B protein bar   Snack - not a sweets person  Beverage - water, tea , coffee   Targeting 150g protein per day: protein shake, [employer] chicken for snack      Symptoms:  Blurry vision: No  Numbness/tingling: No  Polyuria: No - has overactive bladder   Polydipsia: No  Nausea/Vomiting: No      Past Medical History:     Past Medical History:   Diagnosis Date    Chicken pox     Headache     Heart murmur     Hyperlipidemia     Hypertension     Sexually transmitted infection     Sleep apnea        Past Surgical History:     Past Surgical History:   Procedure Laterality Date    BREAST SURGERY      WISDOM TOOTH EXTRACTION         Allergies:     Allergies   Allergen Reactions    Atorvastatin Diarrhea and Nausea And Vomiting     Patient states tolerates BRAND only    Oxycodone        Medications:     Medications that the patient states to be currently taking   Medication Sig    lisinopril 20 mg tablet Take 1 tablet (20 mg total) by mouth daily.    metoprolol succinate 25 mg 24 hr tablet Take 1 tablet (25 mg total) by mouth two (2) times daily.    mirabegron (MYRBETRIQ) 25 mg ER tablet Take 1 tablet (25 mg total) by mouth daily.    traMADol 50 mg tablet Take 1 tablet (50 mg total) by mouth every six (6) hours as needed. Max Daily Amount: 200 mg    triamterene-hydroCHLOROthiazide 37.5-25 mg capsule Take 1 capsule by mouth daily.    [DISCONTINUED] tirzepatide (MOUNJARO) 10 mg/0.5 mL injection Inject 0.5 mLs (10 mg total) under the skin once a week.     Social History:     Social History     Tobacco Use    Smoking status: Never    Smokeless tobacco: Never   Substance Use Topics    Alcohol use: Yes     Comment: Rarely, few times a year     Social History     Social History Narrative    Not on file       Family History:     Family History   Problem Relation Age of Onset    Hypothyroidism Mother     Kidney disease Father     Hypothyroidism Sister        ROS:   Per HPI  [x]  10-point ROS reviewed: normal unless stated in HPI/pertinent ROS in HPI  []  reviewed questionnaire on 05/04/2023 (uploaded)      Physical Exam:     Last Recorded Vital Signs:    05/04/23 1417   BP: 109/75   Pulse: 85   Resp: 16   Temp: 36.6 ?C (97.8 ?  F)   SpO2: 95%     Wt Readings from Last 3 Encounters:   05/04/23 (!) 264 lb (119.7 kg)   04/13/23 (!) 267 lb 9.6 oz (121.4 kg)   04/05/23 (!) 267 lb (121.1 kg)     Body mass index is 47.9 kg/m?Marland Kitchen  BMI Readings from Last 3 Encounters:   05/04/23 47.90 kg/m?   04/13/23 48.55 kg/m?   04/05/23 48.44 kg/m?     System Check if normal Positive or additional negative findings   Constit  [x]  General appearance     Eyes  []  Conj/Lids []  Pupils  []  Fundi     ENMT  [x]  External ears/nose []  Otoscopy   [x]  gross Hearing []  Nasal mucosa   []  Lips/teeth/gums []  Oropharynx    []  mucus membranes     Neck  [x]  Inspection/palpation [x]  Thyroid  +dorsal cervical fat  +dorsal cervical acanthosis    Resp  [x]  Normal effort []  No Wheezing    []  Auscultation  [] No Crackles     CV  [x]  Rhythm/rate   [x]  Murmurs   [x]  LEE   []  JVP non-elevated    Normal pulses:   []  radial []  Femoral  []  Pedal     Breast  []  Inspection []  Palpation     GI  []  abd masses    []  tenderness   []  rebound/guarding   []  Liver/spleen []  Rectal     GU  M: []  Scrotum []  Penis []  Prostate   F:  []  External []  vaginal wall          []  Cervix  []  mucus        []  Uterus    []  Adnexa      Lymph  []  Neck []  Axillae []  Groin     MSK Specify site examined:    []  Inspect/palp []  ROM   []  Stability []  Strength/tone         Skin  []  Inspection []  Palpation   []  No acanthosis nigricans   [x]  No purple abd striae   []  No lipodystrophy/hypertrophy    05/04/23  Shoes and socks removed. Feet completely visualized.   Skin is warm and well perfused.   No skin breaks, ulcers or deformities.   No edema  DP pulses 2+  Monofilament test normal     Neuro  [x]  CN2-12 intact grossly   [x]  Alert and oriented   [x]  biceps DTR      []  Sensation   [x]  Gait/balance     Psych  [x]  Insight/judgement     [x]  Mood/affect    [x]  Gross cognition          Laboratory Results:     LABS REVIEWED:     CBC    Lab Results   Component Value Date    WBC 7.25 02/14/2023    HGB 13.2 02/14/2023    HCT 41.4 02/14/2023    MCV 87.7 02/14/2023    PLT 472 (H) 02/14/2023    BMP    Lab Results   Component Value Date    NA 140 02/14/2023    K 4.5 02/14/2023    CL 103 02/14/2023    CO2 23 02/14/2023    BUN 13 02/14/2023    CREAT 0.73 02/14/2023    GLUCOSE 88 02/14/2023     Ca/ PTH/Vit D    Lab Results   Component Value Date    CALCIUM 9.4 02/14/2023    CREAT 0.73 02/14/2023  ALBUMIN 4.1 04/13/2023    CREATRANUR 227.5 04/01/2022    VITD25OH 27 03/30/2022    TSH 1.9 04/13/2023    T4AUTO 1.10 03/30/2022      Lipids    Lab Results   Component Value Date    CHOL 258 04/13/2023    TRIGLY 164 (H) 04/13/2023    CHOLHDL 43 (L) 04/13/2023    NOHDLCHOCAL 215 (H) 04/13/2023    CHOLDLCAL 182 (H) 04/13/2023    HbA1c     Lab Results   Component Value Date    HGBA1C 6.3 (H) 02/14/2023    HGBA1C 6.5 (H) 08/02/2022    HGBA1C 6.4 (H) 03/30/2022    No results for input(s): ''GLUCOSE'' in the last 72 hours.   Recent Labs     05/04/23  1424   GLUCOSEPOC 89    No results for input(s): ''INSULIN'', ''CPEPTIDE'' in the last 72 hours.  Pituitary Labs    Lab Results   Component Value Date    CORTISOL 6 08/09/2022    TSH 1.9 04/13/2023    T4AUTO 1.10 03/30/2022        LFT    Lab Results   Component Value Date    ALT 21 04/13/2023    AST 20 04/13/2023    ALKPHOS 75 04/13/2023    BILITOT 0.4 04/13/2023     Creatinine    Lab Results   Component Value Date    CREAT 0.73 02/14/2023     No results found for: ''GFR'', ''GFREST'', ''GFRESTNOAA'', ''GFRESTAA''     T1DM Eval    Lab Results   Component Value Date    HGBA1C 6.3 (H) 02/14/2023      B Vitamins    Lab Results   Component Value Date    VITAMINB12 754 03/30/2022    FOLATE 14.5 03/30/2022            Iron Panel    Iron   Date Value Ref Range Status   03/30/2022 76 41 - 179 mcg/dL Final     Iron Binding Capacity   Date Value Ref Range Status   03/30/2022 368 262 - 502 mcg/dL Final     % Saturation   Date Value Ref Range Status   03/30/2022 21 % Final     Ferritin   Date Value Ref Range Status   03/30/2022 118 8 - 180 ng/mL Final     Comment:     Ingestion of high levels of biotin in dietary supplements may lead to falsely decreased results.    Fat Soluble Vitamins    Lab Results   Component Value Date    INR 1.1 02/14/2023          Imaging and Studies:     No US Thyroid in the last 365 days    Review of Data:   ORDERS PLACED ON TODAY'S VISIT:  Orders Placed This Encounter    IGF-1, LC/MS    Cortisol by LC-MS/MS, Salivary    Cortisol by LC-MS/MS, Salivary    Hgb A1c    Albumin/Creat Ratio Ur    Cortisol    ACTH    Lipid Panel    Hgb A1c    Comprehensive Metabolic Panel    Referral for Sleep Consultation    POCT glucose    tirzepatide (MOUNJARO) 12.5 mg/0.5 mL injection       I have:   []  Reviewed/ordered []  1 []  2 []  >= 3 unique laboratory, radiology, and/or diagnostic tests noted above:    []   Reviewed []  1 []  2 []  >= 3 prior external notes and incorporated into patient assessment, as noted:    Date of Note Author of note                 []  Independently interpreted studies from another health care professional, as noted:    Date of Study Name of study                 []  Discussed management and/or test interpretation with external provider(s), as noted:    Provider Provider's response                     Assessment & Plan:     PROBLEM(S)  1. Type 2 diabetes mellitus without complication, without long-term current use of insulin (HCC/RAF)    2. Class 3 severe obesity with serious comorbidity and body mass index (BMI) of 45.0 to 49.9 in adult, unspecified obesity type (HCC/RAF)    3. Hyperlipidemia associated with type 2 diabetes mellitus (HCC/RAF)    4. Hypertension, unspecified type    5. OSA (obstructive sleep apnea)        PLAN    1. Diabetes mellitus type 2  Lab Results   Component Value Date    HGBA1C 6.3 (H) 02/14/2023    HGBA1C 6.5 (H) 08/02/2022    HGBA1C 6.4 (H) 03/30/2022     Controlled T2DM with no hypoglycemia. Diabetes-related complications include none. A1C goal < 6.5% to reduce risk of diabetes related complications to the eyes, feet and kidneys with additional goal of hypoglycemia risk reduction.     - Medication adjustment: increase Mounjaro to 12.5mg  weekly  - Continue glucose monitoring, reviewed target pre- and post prandial BG (before meals: 80-130; 2 hrs after meals: <180)  - Lab: CMP, TSH, lipid panel up to date. A1c, UMA/Cr in 11 days. Repeat lipid, A1c, CMP in 3 months    - Reviewed dietary modifications and importance of carb controlled diet  - Exercise as tolerated.  - Reviewed signs and symptoms of hyper/hypoglycemia  - Counseled on basic diabetes physiology/pathophysiology and glucose/insulin physiology.   - Reviewed various classes of diabetes medications and mechanism of action  - Discussed risk of long-term microvascular complications in relation to diabetes control and its relation to HgbA1C.  - Complication screening:  Annual ophthalmic examinations - due 01/2024  Annual diabetic foot examinations - due 04/2023  Annual UMA/Cr - due now    2. Hypertension, essential  Blood pressure goal is <140/90 or <130/80 may be appropriate for those at high risk for cardiovascular disease, if it can be achieved safely.  - Continue current regimen, including ACE inhibitor.    3. Hyperlipidemia   Per ACC/AHA and ACE/AACE guidelines, statin therapy is recommended with history of diabetes mellitus.  - Resume Lipitor as soon as possible - cannot tolerate generic atorvastatin     4. Diabetic microvascular disease screening:  There are no preventive care reminders to display for this patient.    5. BMI 47  - choose weight negative or neutral medications  - reviewed and diet and exercise extensively  Track calorie, goal 1800-2000   Try to add 2 days per week of resistance training   Try paddle machine under table   Move larger meal earlier in the day  - check LNSC x 2 and IGF-1 given lack of weight loss on GLP1/GIP agonist and acanthosis nigricans     6. OSA  - sleep clinic referral. Mild  OSA per pt 4-5 yrs ago. Now weight higher. Untreated OSA contributes to HTN and obesity         Monitoring patient's current drug therapy of above managed conditions for possible toxicity.      ICD-10-CM    1. Type 2 diabetes mellitus without complication, without long-term current use of insulin (HCC/RAF)  E11.9 POCT glucose     tirzepatide (MOUNJARO) 12.5 mg/0.5 mL injection     Hgb A1c     Albumin/Creat Ratio Ur     Lipid Panel     Hgb A1c     Comprehensive Metabolic Panel      2. Class 3 severe obesity with serious comorbidity and body mass index (BMI) of 45.0 to 49.9 in adult, unspecified obesity type (HCC/RAF)  [phone] Referral for Sleep Consultation    E66.01 IGF-1, LC/MS    Z68.42 Cortisol by LC-MS/MS, Salivary     Cortisol by LC-MS/MS, Salivary     Cortisol     ACTH      3. Hyperlipidemia associated with type 2 diabetes mellitus (HCC/RAF)  E11.69     E78.5       4. Hypertension, unspecified type  I10       5. OSA (obstructive sleep apnea)  G47.33 Referral for Sleep Consultation        Orders Placed This Encounter    IGF-1, LC/MS    Cortisol by LC-MS/MS, Salivary    Cortisol by LC-MS/MS, Salivary    Hgb A1c    Albumin/Creat Ratio Ur    Cortisol    ACTH    Lipid Panel    Hgb A1c    Comprehensive Metabolic Panel    Referral for Sleep Consultation    POCT glucose    tirzepatide (MOUNJARO) 12.5 mg/0.5 mL injection         NEW PROBLEMS AS OF 05/04/2023:     []  New minor/self-limited problem  []  Acute uncomplicated illness/injury   []  Acute systemic illness or complicated injury   []  New problem with uncertain diagnosis  []  New problem that poses threat to life or bodily function        Risk of Complication:   This Clinical research associate has deemed the above diagnoses to have a risk of complication, morbidity or mortality of:  []  Minimal  []  Low  [x]  Moderate  []  Severe     Social Determinants of Health:   []  The diagnosis or treatment of said conditions is significantly limited by social determinants of health    The above recommendation were discussed with the patient.  The patient has all questions answered satisfactorily and is in agreement with this recommended plan of care.    Patient Instructions   Track calorie, goal 1800-2000   Try to add 2 days per week of resistance training   Try paddle machine under table   Move larger meal earlier in the day    Referral to sleep clinic     Increase Mounjaro to 12.5mg  weekly. In 1 month message clinic you want a dose increase     Resume Lipitor when able    Check fasting labs 8-9am in 11 days     Late night salivary cortisol collection instructions:  - pick up salivary cortisol testing kits x 2 from any [facility] lab  - Collect salivary cortisol at natural bedtime for 2 nights straight  - No brushing teeth/eating/drinking x 1 hour before sample collection, avoid intense exercise and alcohol the afternoon before sample collection.   -  Drop off both sample at lab    RTC in 3 months, repeat fasting labs before visit (lipid, A1c, cmp)         Return in about 3 months (around 08/14/2023).    I spent 55 minutes counseling and coordinating care for the items checked below on the day of service  [x]  Preparing to see the patient (e.g., review of tests)  [x]  Obtaining and or reviewing separately obtained history   [x]  Performing a medically appropriate examination and/or evaluation   [x]  Counseling and educating the patient/family/caregiver   [x]  Ordering medications, tests, or procedures  []  Referring and communicating with other healthcare professionals (when not separately reported)  [x]  Documenting clinical information in the EHR  [x]  Independently interpreting results and communicating results to patient/ family/caregiver     Author:  Candace Cruise, MD 05/04/2023 3:00 PM

## 2023-05-04 NOTE — Patient Instructions
 Track calorie, goal 1800-2000   Try to add 2 days per week of resistance training   Try paddle machine under table   Move larger meal earlier in the day    Referral to sleep clinic     Increase Mounjaro to 12.5mg  weekly. In 1 month message clinic you want a dose increase     Resume Lipitor when able    Check fasting labs 8-9am in 11 days     Late night salivary cortisol collection instructions:  - pick up salivary cortisol testing kits x 2 from any [facility] lab  - Collect salivary cortisol at natural bedtime for 2 nights straight  - No brushing teeth/eating/drinking x 1 hour before sample collection, avoid intense exercise and alcohol the afternoon before sample collection.   - Drop off both sample at lab    RTC in 3 months, repeat fasting labs before visit (lipid, A1c, cmp)

## 2023-05-06 ENCOUNTER — Other Ambulatory Visit: Payer: PRIVATE HEALTH INSURANCE

## 2023-05-07 ENCOUNTER — Ambulatory Visit: Payer: PRIVATE HEALTH INSURANCE

## 2023-05-08 ENCOUNTER — Ambulatory Visit: Payer: PRIVATE HEALTH INSURANCE | Attending: Medical

## 2023-05-08 DIAGNOSIS — G5601 Carpal tunnel syndrome, right upper limb: Secondary | ICD-10-CM

## 2023-05-08 NOTE — Progress Notes
 Date:  05/08/2023    Name:  Jamie Cohen  MRN:   [id number]  DOB:   [birth year]  Age:   48 y.o.      Jamie A.  Jamie Fusi, MD  Jamie Racer, PA-C    The patient was seen in our office by Jamie Racer, PA-C for Jamie Stain, MD.      Chief Complaint: Right hand pain     History: [facility] presents today for evaluation regarding the above chief complaint. Jamie Cohen is a 48 y.o. female who presents with a several month history of symptoms after no injury.     The pain is localized in the right hand. She notes swelling and notes numbness. Moreover, the symptoms are exacerbated by use and are alleviated by rest.      Past Medical History and Review of Systems: The patient?s intake sheet, including past medical history, past surgical history, medicines, allergies, social and family history and review of systems was reviewed by myself with the patient today. There are no pertinent changes from the previous visit. Details are noted and documented in the patient?s file.    Past Medical History:   Diagnosis Date    Chicken pox     Headache     Heart murmur     Hyperlipidemia     Hypertension     Sexually transmitted infection     Sleep apnea        Social History     Socioeconomic History    Marital status: Other   Tobacco Use    Smoking status: Never    Smokeless tobacco: Never   Substance and Sexual Activity    Alcohol use: Yes     Comment: Rarely, few times a year    Drug use: Never    Sexual activity: Not Currently     Birth control/protection: IUD (non hormonal)   Other Topics Concern    Do you exercise at least a day, 3 or more days a week? No    Do you follow a special diet? No    Vegan? No    Vegetarian? No    Pescatarian? No    Lactose Free? No    Gluten Free? No    Omnivore? Yes             Physical Exam:   Vitals:    05/08/23 0829   Weight: (!) 264 lb (119.7 kg)   Height: 5' 2.25'' (1.581 m)        and   Vitals:    05/08/23 0829   Weight: (!) 264 lb (119.7 kg)   Height: 5' 2.25'' (1.581 m) Constitutional:  Well appearing, no apparent distress.  The patient?s general appearance is well dressed well nourished. The body habitus is normal.    Psychiatric: Patient is alert and oriented to person, time and place, with a pleasant mood and affect.      Eyes: Extraocular movements are intact, pupils are symmetric. No conjunctivitis or icterus is present; eyelids appear normal.     Skin: No lesions or rash noted.     Musculoskeletal and Neurological: Sensation intact to light touch. 2 point discrimination is less than 6mm in all digits unless otherwise noted.  Motor exam is 5/5 throughout bilateral upper extremities.   Exam right hand: Well healed incision. Mild diffuse swelling and tenderness around incision. No obvious signs of infection. Neurovascularly intact.       Test Results:  EMG 05/25/2022: My independent review of  the nerve conduction study shows there is bilateral carpal tunnel. These are consistent with the report stated below.   This is an abnormal electrodiagnostic examination.   There is electrodiagnostic evidence suggestive of a right median neuropathy across the wrist, affecting the sensory and motor fibers. There is evidence of both demyelination and axon loss. These findings are consistent with a diagnosis of right carpal tunnel syndrome, and the electrodiagnostic abnormalities are moderate. There is no electrodiagnostic evidence of a right ulnar neuropathy across the wrist or elbow. There is no electrodiagnostic evidence of a right cervical radiculopathy.   Comparative testing to the contralateral limb suggests a mild left carpal syndrome. Correlate clinically.      Radiographs: PREVIOUS 3 views of the right hand which were ordered, obtained and evaluated by me here in the [facility] office today shows no evidence of fracture, arthritis or subluxation.     Impression:  Left carpal tunnel syndrome    Postoperative 02/16/2023:  Right carpal tunnel release, open  Right thumb flexor tenosynovectomy at the level of the palm     Plan: Findings, diagnoses, prognosis, and treatment options were reviewed and discussed with the patient, with all questions answered.     Advised to use the hand without restriction to best of their capability  Continue with daily ROM and strengthening activities.   Continue with therapy as scheduled.    Next Appointment: PRN CL  [x]  Reassessment of pain  [x]  No X-Ray  []  X-rays     I recommend a course of treatment as checked below:    []  Splinting/Bracing/Immobilization   []  Aspiration/Injection  []  Fracture Care  []  Decision for Surgery  []  Referral to PT/OT for formal program  []  Imaging study  []  Consultation for electrodiagnostic studies  []  Inflammatory labs  []  Prescription: Meloxicam 7.5mg  orally once daily for 30 days  []  Prescription: Norco 5/325mg  as needed orally once every 6 hours for pain  []  Prescription: Tramadol 50mg  as needed orally every 6 hours for pain  []  Prescription: Voltaren Gel     The patient was examined and evaluated by Jamie Racer, PA-C for Jamie Stain, MD.  The evaluation and plan was reviewed and approved by Jamie Stain, MD.

## 2023-05-12 ENCOUNTER — Telehealth: Payer: PRIVATE HEALTH INSURANCE

## 2023-05-12 NOTE — Telephone Encounter
 Called pharmacy.   Pt picked up medication 05/04/2023

## 2023-05-18 ENCOUNTER — Other Ambulatory Visit: Payer: BC Managed Care – HMO

## 2023-05-19 DIAGNOSIS — N3281 Overactive bladder: Secondary | ICD-10-CM

## 2023-05-19 MED ORDER — MYRBETRIQ 25 MG PO TB24
25 mg | ORAL_TABLET | Freq: Every day | ORAL | 3 refills | Status: AC
Start: 2023-05-19 — End: ?

## 2023-05-19 NOTE — Telephone Encounter
 Medication Request     Patient is requesting:  Requested Prescriptions     Pending Prescriptions Disp Refills    MYRBETRIQ 25 MG ER tablet 90 tablet 3     Sig: Take 1 tablet (25 mg total) by mouth daily.        2. Pharmacy:     [facility] [address] [place] - [address]  [address]  [place] [address]  Phone: [phone] Fax: [phone]         3. Patient's Last Visits:  Next Appt: Visit date not found  CPE: 02/17/2023     Office Visit:  02/17/2023     Last Telemedicine Visits:  Last Telemedicine Visits    None                  4. Last Ordered:   Medication Dispense History (from 11/20/2022 to 05/12/2023)       Lisinopril          Dispensed Days Supply Quantity Provider Pharmacy    LISINOPRIL 20 MG TABLET 04/14/2023 30 30 device Octavia Heir, MD [facility] [phone] - W...    LISINOPRIL 20 MG TABLET 02/18/2023 30 30 device Octavia Heir, MD [facility] [phone] - W...    LISINOPRIL 20 MG TABLET 01/23/2023 30 30 device Hadj Rowe Robert, DO [facility] [phone] - W...    LISINOPRIL 20 MG TABLET 12/26/2022 30 30 device Hadj Rowe Robert, DO [facility] [phone] - W...                    Metoprolol Succinate          Dispensed Days Supply Quantity Provider Pharmacy    METOPROLOL [facility] ER 25 MG TAB 04/14/2023 30 30 device Roseanne Reno, MD [facility] [phone] - W...    METOPROLOL SUCC ER 25 MG TAB 03/17/2023 30 30 device Roseanne Reno, MD [facility] [phone] - W...    METOPROLOL SUCC ER 25 MG TAB 02/18/2023 30 60 device Roseanne Reno, MD [facility] [phone] - W...    METOPROLOL SUCC ER 25 MG TAB 01/23/2023 30 30 device Roseanne Reno, MD [facility] [phone] - W...    METOPROLOL SUCC ER 25 MG TAB 12/26/2022 30 30 device Roseanne Reno, MD [facility] [phone] - W...                    Mirabegron          Dispensed Days Supply Quantity Provider Pharmacy    [facility] ER 25 MG TABLET 03/07/2023 30 30 device Hadj Rowe Robert, [place] [facility] [phone] - W... MYRBETRIQ ER 25 MG TABLET 02/07/2023 30 30 device Hadj Rowe Robert, DO [facility] [phone] - W...    MYRBETRIQ ER 25 MG TABLET 12/27/2022 30 30 device Hadj Rowe Robert, DO [facility] [phone] - W...                    Tirzepatide          Dispensed Days Supply Quantity Provider Pharmacy    MOUNJARO 12.5 MG/0.5 ML PEN 05/04/2023 28 2 mL Candace Cruise, MD [facility] [phone] - W...    MOUNJARO 10 MG/0.5 ML PEN 04/01/2023 28 2 mL Octavia Heir, MD [facility] [phone] - W...    MOUNJARO 10 MG/0.5 ML PEN 02/17/2023 28 2 mL Octavia Heir, MD [facility] [phone] - W...                    traMADol HCl  Dispensed Days Supply Quantity Provider Pharmacy    TRAMADOL HCL 50 MG TABLET 02/06/2023 7 30 device Hart Rochester. [facility] [phone] - W...                    Triamterene-HCTZ          Dispensed Days Supply Quantity Provider Pharmacy    TRIAMTERENE-HCTZ 37.5-25 MG TB 02/28/2023 30 30 device Octavia Heir, MD [facility] [phone] - W...                    valACYclovir HCl          Dispensed Days Supply Quantity Provider Pharmacy    VALACYCLOVIR HCL 1 GRAM TABLET 04/13/2023 15 30 device Viviann Spare., MD [facility] [phone] - W...                    Other          Dispensed Days Supply Quantity Provider Pharmacy    MOUNJARO 10MG /0.5ML INJ LILL 12/11/2022 28 2 mL Hadj Smaine, Lina, DO [facility] [phone]                          Disclaimer    Certain information may not be available or accurate in this report, including over-the-counter medications, low cost prescriptions, prescriptions paid for by the patient or non-participating sources, or errors in insurance c laims information. The provider should independently verify medication history with the patient.       External Sources       Source Last Checked for Updates Status    Surescripts (Ambulatory) 05/12/2023  9:27 AM History Response Filed    External Claim 05/19/2023  8:07 AM     External Claim 05/19/2023  8:07 AM     External Claim 05/19/2023  8:07 AM CURES Generated if able/required    Labs/imaging:      Lab Results   Component Value Date    WBC 7.25 02/14/2023    HGB 13.2 02/14/2023    HCT 41.4 02/14/2023    MCV 87.7 02/14/2023    PLT 472 (H) 02/14/2023     Lab Results   Component Value Date    CREAT 0.73 02/14/2023    BUN 13 02/14/2023    NA 140 02/14/2023    K 4.5 02/14/2023    CL 103 02/14/2023    CO2 23 02/14/2023    GLUCOSE 88 02/14/2023     Lab Results   Component Value Date    ALT 21 04/13/2023    AST 20 04/13/2023    ALKPHOS 75 04/13/2023    BILITOT 0.4 04/13/2023     Lab Results   Component Value Date    CHOL 258 04/13/2023    CHOLDLCAL 182 (H) 04/13/2023    CHOLHDL 43 (L) 04/13/2023    TRIGLY 164 (H) 04/13/2023    NOHDLCHOCAL 215 (H) 04/13/2023     Lab Results   Component Value Date    HGBA1C 6.3 (H) 02/14/2023     Lab Results   Component Value Date    TSH 1.9 04/13/2023    T4AUTO 1.10 03/30/2022       XR chest pa+lat (2 views)  Result Date: 02/11/2023  IMPRESSION: No previous studies available for comparison. Cardiomediastinal contour unremarkable. No congestion, focal pneumonic consolidation, significant atelectatic opacity or pneumothorax. No pleural effusions. No acute osseous abnormality seen. Signed by: Sherolyn Buba   02/11/2023 5:59 PM  Echo adult transthoracic complete  Result Date: 12/28/2022   1. Normal left ventricular size.  2. Mild concentric left ventricular hypertrophy.  3. Left ventricular ejection fraction is approximately 65 to 70%.  4. Diastolic function is indeterminant due to mitral valve pathology.  5. 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 Mean gradient across mitral valve is 8.0 mmHg at a heart rate of 78 bpm.  6. Mild mitral valve regurgitation.  7. Upper normal left atrium in size.  8. Compared to prior study on 06/24/22, there are no significant changes. [id number] Roseanne Reno MD Electronically signed by [id number] Roseanne Reno MD on 12/28/2022 at 4:43:22 PM  Sonographer: Nonah Mattes Final     US pelvis transabd and transvaginal  Result Date: 08/01/2022  IMPRESSION: Low-lying intrauterine device in the lower uterine segment. IArdeen Fillers, M.D., have reviewed this radiological study personally and I am in full agreement with the findings of the report presented here. Signed by: Ardeen Fillers   08/01/2022 2:18 PM    Echo adult transthoracic complete  Result Date: 06/27/2022   1. Technically difficult study.  2. Normal left ventricular size.  3. Mild septal left ventricular hypertrophy.  4. The calculated ejection fraction (Simpson's) is 62 %.  5. 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 The mean diastolic gradient across the mitral valve is 8.0 mmHg at a heart rate of 80 bpm. There is associated mild mitral regurgitation  6. Abnormal LV diastolic function.  7. Normal PA systolic pressure. Normal left atrial size  8. There are no prior studies on this patient for comparison purposes. [id number] Roseanne Reno MD Electronically signed by [id number] Roseanne Reno MD on 06/27/2022 at 9:57:44 AM  Sonographer: Jamse Belfast   Final

## 2023-05-23 ENCOUNTER — Ambulatory Visit: Payer: PRIVATE HEALTH INSURANCE

## 2023-05-23 DIAGNOSIS — E66813 Class 3 severe obesity with serious comorbidity and body mass index (BMI) of 45.0 to 49.9 in adult, unspecified obesity type (HCC/RAF): Secondary | ICD-10-CM

## 2023-05-23 DIAGNOSIS — Z6841 Body Mass Index (BMI) 40.0 and over, adult: Secondary | ICD-10-CM

## 2023-05-23 MED ORDER — DEXAMETHASONE 1 MG PO TABS
ORAL_TABLET | 0 refills | Status: AC
Start: 2023-05-23 — End: ?

## 2023-05-30 ENCOUNTER — Other Ambulatory Visit: Payer: BC Managed Care – HMO

## 2023-06-01 ENCOUNTER — Other Ambulatory Visit: Payer: BC Managed Care – HMO

## 2023-07-11 ENCOUNTER — Ambulatory Visit: Payer: BC Managed Care – HMO

## 2023-10-11 ENCOUNTER — Ambulatory Visit: Payer: PRIVATE HEALTH INSURANCE

## 2023-10-11 DIAGNOSIS — Z1211 Encounter for screening for malignant neoplasm of colon: Secondary | ICD-10-CM

## 2023-11-20 ENCOUNTER — Inpatient Hospital Stay: Payer: BC Managed Care – HMO

## 2023-11-20 DIAGNOSIS — Z1231 Encounter for screening mammogram for malignant neoplasm of breast: Secondary | ICD-10-CM

## 2023-12-25 ENCOUNTER — Other Ambulatory Visit: Payer: BC Managed Care – HMO

## 2024-01-04 DIAGNOSIS — E119 Type 2 diabetes mellitus without complications: Principal | ICD-10-CM

## 2024-01-27 MED ORDER — METOPROLOL SUCCINATE ER 25 MG PO TB24
25 mg | ORAL_TABLET | Freq: Two times a day (BID) | ORAL | 11 refills
Start: 2024-01-27 — End: ?

## 2024-01-29 MED ORDER — METOPROLOL SUCCINATE ER 25 MG PO TB24
25 mg | ORAL_TABLET | Freq: Two times a day (BID) | ORAL | 11 refills | 90.00000 days | Status: AC
Start: 2024-01-29 — End: ?

## 2024-01-30 MED ORDER — MOUNJARO 12.5 MG/0.5ML SC SOAJ
12.5 mg | SUBCUTANEOUS | 3 refills
Start: 2024-01-30 — End: ?

## 2024-01-31 ENCOUNTER — Ambulatory Visit
Payer: Commercial Managed Care - Pharmacy Benefit Manager | Attending: Student in an Organized Health Care Education/Training Program

## 2024-01-31 MED ORDER — MOUNJARO 12.5 MG/0.5ML SC SOAJ
12.5 mg | SUBCUTANEOUS | 0 refills | 28.00000 days | Status: AC
Start: 2024-01-31 — End: ?

## 2024-02-02 ENCOUNTER — Ambulatory Visit: Payer: BC Managed Care – HMO | Attending: Student in an Organized Health Care Education/Training Program

## 2024-02-02 DIAGNOSIS — R7303 Prediabetes: Secondary | ICD-10-CM

## 2024-02-02 DIAGNOSIS — E785 Hyperlipidemia, unspecified: Secondary | ICD-10-CM

## 2024-02-02 DIAGNOSIS — R002 Palpitations: Secondary | ICD-10-CM

## 2024-02-02 DIAGNOSIS — I050 Rheumatic mitral stenosis: Principal | ICD-10-CM

## 2024-02-02 DIAGNOSIS — I10 Essential (primary) hypertension: Secondary | ICD-10-CM

## 2024-02-02 MED ORDER — METOPROLOL SUCCINATE ER 25 MG PO TB24
25 mg | ORAL_TABLET | Freq: Two times a day (BID) | ORAL | 11 refills
Start: 2024-02-02 — End: ?

## 2024-02-02 NOTE — Progress Notes
 [facility]  [address]  [address]  [place], [place]  [address]  [phone] Patient:  DOB:  MRN:  Date:  PCP: Akiba Melfi  1975/06/20   [id number]   02/02/2024  Dereck Opal, MD        CARDIOLOGY OUTPATIENT HISTORY AND PHYSICAL EXAMINATION    REQUESTING PHYSICIAN.  Hadj Smaine, Lina, DO    REASON FOR CONSULTATION: HTN    HISTORY OF PRESENT ILLNESS:  Jamie Cohen is a 48 y.o. female with a past medical history significant for HTN, HL who presents for comprehensive cardiovascular evaluation.    Just moved to [place] from [place] . History of heart murmur. Hx of HTN and HL. Automated BP cuffs dont work for her.     Denies chest pain, shortness of breath, dyspnea on exertion, orthopnea, paroxysmal noctural dyspnea, leg swelling, palpitations, lightheadedness, near syncope or syncope.    Exercise: fairly sedentary, busy from work.     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 No drugs. Rare alcohol. No children. One pregnancy. - no complications.     Family history: dad had CHF in old age. Mom died of kidney disease.      2022-07-22  Post echo and stress. Exercise induced PACs, no sustained arrhythmias or ischemia. Mod MS. Has had SOB since COVID. Thought it was long COVID. She can walk up and down stairs and feels more winded than she would expect. She is fine on flat ground. She walks around [employer] with no issues.  Feels no irregular heartbeats or palpitations.    01/02/23  Overall better on metoprolol . Less tired sluggish, more walking ability. Interested in running. She was in the [employer].    02/02/24  Staying fairly active. Desk work but moves around. 7k steps daily usually. No limitations with ADLs. Metoprolol  helps. Walking upstairs better now after increasing MTP dose. No palpitations. No chest pain, or SOB.  Has been on mounjaro  12.5 mg for 1 year.    Additional ROS negative unless otherwise noted.    MEDICATIONS.   Current Outpatient Medications   Medication Sig    lisinopril  20 mg tablet Take 1 tablet (20 mg total) by mouth daily.    METOPROLOL  SUCCINATE 25 mg 24 hr tablet TAKE 1 TABLET BY MOUTH TWO TIMES DAILY.    MYRBETRIQ  25 MG ER tablet Take 1 tablet (25 mg total) by mouth daily.    tirzepatide  (MOUNJARO ) 12.5 mg/0.5 mL injection Inject 0.5 mLs (12.5 mg total) under the skin once a week.    triamterene -hydroCHLOROthiazide 37.5-25 mg capsule Take 1 capsule by mouth daily.    dexAMETHasone  1 mg tablet Take a 11pm (Patient not taking: Reported on 02/02/2024)    traMADol  50 mg tablet Take 1 tablet (50 mg total) by mouth every six (6) hours as needed. Max Daily Amount: 200 mg (Patient not taking: Reported on 02/02/2024)    [DISCONTINUED] metoprolol  succinate 25 mg 24 hr tablet Take 1 tablet (25 mg total) by mouth two (2) times daily.    [DISCONTINUED] tirzepatide  (MOUNJARO ) 12.5 mg/0.5 mL injection Inject 0.5 mLs (12.5 mg total) under the skin once a week.     No current facility-administered medications for this visit.      ALLERGIES.  Allergies   Allergen Reactions    Atorvastatin  Diarrhea and Nausea And Vomiting     Patient states tolerates BRAND only    Oxycodone      PAST MEDICAL HISTORY.  Past Medical History:   Diagnosis Date  Chicken pox     Headache     Heart murmur     Hyperlipidemia     Hypertension     Sexually transmitted infection     Sleep apnea      PAST SURGICAL HISTORY.  Past Surgical History:   Procedure Laterality Date    BREAST SURGERY      WISDOM TOOTH EXTRACTION       FAMILY HISTORY.   Family History   Problem Relation Age of Onset    Hypothyroidism Mother     Kidney disease Father     Hypothyroidism Sister      SOCIAL HISTORY.   Social History     Socioeconomic History    Marital status: Other   Tobacco Use    Smoking status: Never    Smokeless tobacco: Never   Substance and Sexual Activity    Alcohol use: Yes     Comment: Rarely, few times a year    Drug use: Never    Sexual activity: Not Currently     Birth control/protection: IUD (non hormonal)   Other Topics Concern    Do you exercise at least a day, 3 or more days a week? No    Do you follow a special diet? No    Vegan? No    Vegetarian? No    Pescatarian? No    Lactose Free? No    Gluten Free? No    Omnivore? Yes       PHYSICAL EXAMINATION.    BP 102/68  ~ Pulse 82  ~ Resp 16  ~ Ht 5' 2.25'' (1.581 m)  ~ Wt (!) 260 lb (117.9 kg)  ~ SpO2 96%  ~ BMI 47.17 kg/m?       System Check if normal Positive or additional negative findings   Constit  [x]  General appearance     Eyes  []  Conj/Lids []  Pupils  []  Fundi     HEENT  [x]  External ears/nose []  Otoscopy   [x]  Gross Hearing []  Nasal mucosa   []  Lips/teeth/gums []  Oropharynx    []  mucus membranes [x]  Head     Neck  []  Inspection/palpation []  Thyroid     Resp  [x]  Effort []  Wheezing    [x]  Auscultation  []  Crackles     CV  [x]  Rhythm/rate   []  Murmurs   []  Lower extremity edema   [x]  JVP non-elevated    [x]  Carotid  auscultation normal   Normal pulses:   [x]  Radial []  Femoral  []  Pedal   Diastolic murmur    GI  []  abd masses    []  tenderness   []  rebound/guarding   []  Liver/spleen []  Rectal     Neuro  []  CN2-12 intact grossly   [x]  Alert and oriented   []  DTR      []  Muscle strength      []  Sensation   []  Gait/balance     Psych  [x]  Insight/judgement     []  Mood/affect    [x]  Gross cognition          LABORATORY STUDIES.   Reviewed during this encounter    Complete Blood Count Metabolic Panel Lipid Analysis   Lab Results   Component Value Date    WBC 7.25 02/14/2023    HGB 13.2 02/14/2023    HCT 41.4 02/14/2023    PLT 472 (H) 02/14/2023    Lab Results   Component Value Date    NA 135 06/17/2023  K 4.5 06/17/2023    BUN 9 06/17/2023    CREAT 0.64 06/17/2023    GLUCOSE 95 06/17/2023    AST 21 06/17/2023    ALT 16 06/17/2023    ALKPHOS 75 06/17/2023    BILITOT 0.4 06/17/2023    Lab Results   Component Value Date    CHOL 301 06/17/2023    CHOLDLCAL 232 (H) 06/17/2023    CHOLHDL 50 (L) 06/17/2023    NOHDLCHOCAL 251 (H) 06/17/2023    TRIGLY 97 06/17/2023      No results found for: ''TROPONIN'', ''BNP''  Lab Results   Component Value Date    HGBA1C 5.9 (H) 06/17/2023       DIAGNOSTIC STUDIES.    TTE Reviewed by me  No results found.     ACC/AHA Cholesterol Management Guideline Recommendations:   Alternative LDL lowering therapy because of ASCVD diagnosis or LDL>=190 mg/dL and statin intolerance. Patient is not currently receiving non-statin therapy. Consider initiation of non-statin therapy, preferentially with a PCSK9 inhibitor if clinically indicated.    ASCVD Risk Score    10-year ASCVD risk  N/A. Patient has ASCVD or LDL>=190 as of 2:17 PM on 87/94/7974.   10-year ASCVD risk with optimal risk factors  is 0.7%.   Values used to calculate ASCVD Risk Score    Age  3 y.o.     Gender  female   Race  African American   HDL Cholesterol  50 mg/dL. (measured on 06/17/2023)   LDL Cholesterol  232 mg/dL. (measured on 06/17/2023)   Total Cholesterol  301 mg/dL. (measured on 06/17/2023)   Systolic Blood Pressure  102 mm Hg. (measured on 02/02/2024)   Blood Pressure Medication Present  Yes   Smoking Status  currently not a smoker   Diabetes Present  No     Click here for the [facility] ASCVD Cardiovascular Risk Estimator Plus tool office manager).    Echo 05/2022  CONCLUSIONS   1. Technically difficult study.   2. Normal left ventricular size.   3. Mild septal left ventricular hypertrophy.   4. The calculated ejection fraction (Simpson's) is 62 %.   5. 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 The mean diastolic gradient across the mitral valve is 8.0 mmHg at a heart rate of 80 bpm. There is associated mild mitral regurgitation   6. Abnormal LV diastolic function.   7. Normal PA systolic pressure. Normal left atrial size   8. There are no prior studies on this patient for comparison purposes.     Stress echo 04/2022  SUMMARY:   1. Fair exercise tolerance. The patient exercised for 6 min and 32 sec to stage III, achieving 8.0 METs.   2. Blood pressure response to stress was hypertensive (240/82 mmHg).   3. Heart rate response to stress was adequate (>85% MPHR).   4. Stress test was adequate for inducing target heart rate and/or exercise response.   5. Moderate PACs observed during recovery. Patient asymptomatic.   6. Patient experienced shortness of breath during peak exercise. No chest pain reported.   7. Patient reported hypertensive blood pressure readings on the left arm. Last blood pressure recorded was obtained from the right arm.   8. ECG findings are not suggestive of ischemia.   9. Echocardiogram is not suggestive of ischemia to the extent achieved.     Echo 11/2022  CONCLUSIONS   1. Normal left ventricular size.   2. Mild concentric left ventricular hypertrophy.   3. Left ventricular ejection fraction  is approximately 65 to 70%.   4. Diastolic function is indeterminant due to mitral valve pathology.   5. 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 Mean gradient across mitral valve is 8.0 mmHg at a heart rate of 78 bpm.   6. Mild mitral valve regurgitation.   7. Upper normal left atrium in size.   8. Compared to prior study on 06/24/22, there are no significant changes.    ECG 04/28/22 Reviewed by me: Normal sinus rhythm 81 bpm,   ECG 02/02/24: Normal sinus rhythm 83 bpm, biatrial enlargement        IMPRESSION  Jamie Cohen is a 48 y.o. female with:  Rheumatic mitral stenosis - moderate on echo 11/2022  Exercise induced PACs  HTN  HL - LDL 200s without statin  Prediabetes  Morbid obesity    RECOMMENDATIONS/PLAN  - moderate MS  - lisinopril  20 mg  - triamterene -HCTZ 37.5-25 mg -> takes half tablet  - metoprolol  succinate 25 mg daily to twice a day to target HR in 60s at rest  - repeat lipids now, might need statin  - echo in 6 months to evaluate MS progression  - low intensity steady state exercise is preferable to high intensity interval training - stay below 70-75% max predicted HR range  - Counseled patient on importance of diet and exercise and encouraged adherence to a heart-healthy diet.  - return visit after echo and labs    The above recommendations were discussed in detail with the patient.  All questions were answered satisfactorily and the patient is in full agreement with this recommended plan of care.    Thank you, Hadj Smaine, Lina, DO for involving me in your patient's care.  Please feel free to contact me with any questions or concerns.    AUTHOR:   Dale Pike, MD  Assistant Professor  [facility]  [address]  [address]  [place], [place]  [address]  [phone]    02/02/2024 2:17 PM    Complex medical decision making

## 2024-02-02 NOTE — Patient Instructions
-   moderate MS  - lisinopril  20 mg  - triamterene -HCTZ 37.5-25 mg -> takes half tablet  - metoprolol  succinate 25 mg daily to twice a day to target HR in 60s at rest  - repeat lipids now, might need statin  - echo in 6 months to evaluate MS progression  - low intensity steady state exercise is preferable to high intensity interval training - stay below 70-75% max predicted HR range  - Counseled patient on importance of diet and exercise and encouraged adherence to a heart-healthy diet.  - return visit after echo and labs

## 2024-02-03 MED ORDER — LISINOPRIL 20 MG PO TABS
20 mg | ORAL_TABLET | Freq: Every day | ORAL | 3 refills | 90.00000 days | Status: AC
Start: 2024-02-03 — End: ?

## 2024-02-03 MED ORDER — TRIAMTERENE-HCTZ 37.5-25 MG PO CAPS
1 | ORAL_CAPSULE | Freq: Every day | ORAL | 3 refills | 17.50000 days | Status: AC
Start: 2024-02-03 — End: ?

## 2024-02-06 ENCOUNTER — Ambulatory Visit
Payer: Commercial Managed Care - Pharmacy Benefit Manager | Attending: Student in an Organized Health Care Education/Training Program

## 2024-02-09 ENCOUNTER — Other Ambulatory Visit: Payer: Commercial Managed Care - Pharmacy Benefit Manager

## 2024-02-12 ENCOUNTER — Ambulatory Visit: Payer: Commercial Managed Care - Pharmacy Benefit Manager

## 2024-02-12 ENCOUNTER — Telehealth: Payer: BC Managed Care – HMO

## 2024-02-12 NOTE — Telephone Encounter
 Call Back Request      Reason for call back: Patient is calling to r/s her echo appt to [facility]. Requesting a Saturday appt     Please assist     Patient is frustrated she was given a ''direct'' phone number of [phone] to r/s appt     Explained to pt this is a general phone number that will redirect her to call center. Would like to speak to a manager in regards to that           Any Symptoms:  []  Yes  [x]  No      If yes, what symptoms are you experiencing:    Duration of symptoms (how long):    Have you taken medication for symptoms (OTC or Rx):      If call was taken outside of clinic hours:    [] Patient or caller has been notified that this message was sent outside of normal clinic hours.     [] Patient or caller has been warm transferred to the physician's answering service. If applicable, patient or caller informed to please call us  back if symptoms progress.  Patient or caller has been notified of the turnaround time of 1-2 business day(s).

## 2024-02-13 ENCOUNTER — Telehealth: Payer: Commercial Managed Care - Pharmacy Benefit Manager

## 2024-02-13 NOTE — Telephone Encounter
 Contacted patient to re-schedule echo. Left voice message to call back to reschedule. MP

## 2024-02-13 NOTE — Telephone Encounter
 Please contact pt and reschedule echo

## 2024-02-23 ENCOUNTER — Ambulatory Visit: Payer: PRIVATE HEALTH INSURANCE | Attending: Student in an Organized Health Care Education/Training Program

## 2024-02-27 ENCOUNTER — Ambulatory Visit: Payer: PRIVATE HEALTH INSURANCE | Attending: Student in an Organized Health Care Education/Training Program

## 2024-03-06 MED ORDER — MOUNJARO 12.5 MG/0.5ML SC SOAJ
12.5 mg | SUBCUTANEOUS | 0 refills | 28.00000 days | Status: AC
Start: 2024-03-06 — End: ?

## 2024-03-25 ENCOUNTER — Ambulatory Visit: Payer: PRIVATE HEALTH INSURANCE

## 2024-03-25 DIAGNOSIS — R7989 Other specified abnormal findings of blood chemistry: Secondary | ICD-10-CM

## 2024-03-25 DIAGNOSIS — E1169 Type 2 diabetes mellitus with other specified complication: Secondary | ICD-10-CM

## 2024-03-25 DIAGNOSIS — I152 Hypertension secondary to endocrine disorders: Secondary | ICD-10-CM

## 2024-03-25 DIAGNOSIS — E1159 Type 2 diabetes mellitus with other circulatory complications: Secondary | ICD-10-CM

## 2024-03-25 DIAGNOSIS — E119 Type 2 diabetes mellitus without complications: Principal | ICD-10-CM

## 2024-03-25 DIAGNOSIS — E785 Hyperlipidemia, unspecified: Secondary | ICD-10-CM

## 2024-03-25 NOTE — Progress Notes
 PATIENTJilda Cohen  MRN: [id number]  DOB: May 22, 1975  DATE OF SERVICE: 03/25/2024    REFERRING PRACTITIONER: Dereck Opal, MD   PRIMARY CARE PROVIDER: Dereck Opal, MD    Reason for Consultation: Type 2 Diabetes Mellitus  Chief Complaint: Type 2 Diabetes Management    Subjective:     History of Present Illness  Jamie Cohen is a 49 y.o. female with PMHx HTN, HLD, morbid obesity, OSA, type 2 diabetes mellitus who presents for treatment of T2DM.     Previously followed by Dr. Laurier  Here for extended transfer of care visit    T2DM hx:  Type 2 diabetes diagnosed: Diagnosed in 2021 on routine labs  Hospitalizations for DM: None  Family history of DM: None  Past treatments: Ozempic     On mounjaro  over past year  Tolerating well  No GI issues  Has not lost any weight on mounjaro     Current DM Medications  Mounjaro  12.5mg  once weekly    Information pertinent to GLP-1 RA therapy for diabetes mellitus  Yes  No  Comments   Personal or family history of MTC  []    [x]      Personal or family history of MEN 2  []    [x]      History of pancreatitis  []    [x]      History of gastroparesis or other severe GI disease  []   [x]     History of pancreatic cancer  []   [x]       Glucose Review:  Not checking. Wants a specific glucometer. Will message me the name    Exercise:   Walking    Nutrition:  Breakfast: Coffee, protein bar   Lunch: Variable  Dinner: Usually light, often eats late at night  Snacks: Chews gum  Drinks: No soda. Rare lemonade    Has sedentary desk job      Complications & Associated Co-morbidities Yes No Comments/Treatment to Date   Retinopathy  []    [x]   Reports she had recent evaluation that was normal   Nephropathy  []    [x]   Lab Results   Component Value Date    CREAT 0.64 06/17/2023    ALBCREATUR 12.4 06/17/2023      Neuropathy  []    [x]      Gastroparesis  []   [x]     CAD  []   [x]     Stroke  []   [x]     PVD  []    [x]      Sleep Apnea  [x]    []   Diagnosed with mild sleep apnea 5-7 years ago. Not on CPAP     Routine Maintenance Yes No Comments/Last Performed   ACE-I  [x]    []   Lisinopril  20mg    Statin  []    [x]   Did not tolerate generic atorvastatin . Did well with brand lipitor  previously   Retinal exam  []    []      Foot exam  []    []      Flu shot  []    []          Past Medical History  Past Medical History:   Diagnosis Date    Chicken pox     Headache     Heart murmur     Hyperlipidemia     Hypertension     Sexually transmitted infection     Sleep apnea        Past Surgical History  Past Surgical History:   Procedure Laterality Date    BREAST SURGERY  WISDOM TOOTH EXTRACTION         Medications  Medications that the patient states to be currently taking   Medication Sig    lisinopril  20 mg tablet Take 1 tablet (20 mg total) by mouth daily.    METOPROLOL  SUCCINATE 25 mg 24 hr tablet TAKE 1 TABLET BY MOUTH TWO TIMES DAILY.    MYRBETRIQ  25 MG ER tablet Take 1 tablet (25 mg total) by mouth daily.    triamterene -hydroCHLOROthiazide 37.5-25 mg capsule Take 1 capsule by mouth daily.    [DISCONTINUED] tirzepatide  (MOUNJARO ) 12.5 mg/0.5 mL injection INJECT 0.5 MLS (12.5 MG TOTAL) UNDER THE SKIN ONCE A WEEK.         Allergies  Atorvastatin  and Oxycodone    Family History  Family History   Problem Relation Age of Onset    Hypothyroidism Mother     Kidney disease Father     Hypothyroidism Sister        Social History  Social History     Socioeconomic History    Marital status: Other   Tobacco Use    Smoking status: Never    Smokeless tobacco: Never   Substance and Sexual Activity    Alcohol use: Yes     Comment: Rarely, few times a year    Drug use: Never    Sexual activity: Not Currently     Birth control/protection: IUD (non hormonal)   Other Topics Concern    Do you exercise at least a day, 3 or more days a week? No    Do you follow a special diet? No    Vegan? No    Vegetarian? No    Pescatarian? No    Lactose Free? No    Gluten Free? No    Omnivore? Yes       Review of Systems:  10 point ROS negative except per HPI    Objective:      Vitals: BP 140/77  ~ Pulse 82  ~ Resp 18  ~ Ht 5' 2.52'' (1.588 m)  ~ Wt (!) 258 lb 9.6 oz (117.3 kg)  ~ SpO2 96%  ~ BMI 46.52 kg/m?    Wt Readings from Last 3 Encounters:   03/25/24 (!) 258 lb 9.6 oz (117.3 kg)   02/02/24 (!) 260 lb (117.9 kg)   05/08/23 (!) 264 lb (119.7 kg)     BMI Readings from Last 3 Encounters:   03/25/24 46.52 kg/m?   02/02/24 47.17 kg/m?   05/08/23 47.90 kg/m?         GENERAL: Well developed, well nourished, no acute distress. +truncal obesity  HEENT: EOMI, MMM   RESPIRATORY: Unlabored breathing  CARDIOVASCULAR: Normal rate on vital signs  PSYCH: Alert. Appropriate; oriented to situation.  NEURO:   no tremors      Lab Review:  Lab Results   Component Value Date    HGBA1C 5.5 03/25/2024    HGBA1C 5.9 (H) 06/17/2023    HGBA1C 6.3 (H) 02/14/2023      Lab Results   Component Value Date    GLUCOSE 95 06/17/2023    ALBCREATUR 12.4 06/17/2023    CHOLDLCAL 209 (H) 02/04/2024      Lab Results   Component Value Date    CREAT 0.64 06/17/2023    BUN 9 06/17/2023    NA 135 06/17/2023    K 4.5 06/17/2023    CL 98 06/17/2023    CO2 21 06/17/2023      Lab Results  Component Value Date    WBC 7.25 02/14/2023    HGB 13.2 02/14/2023    HCT 41.4 02/14/2023    MCV 87.7 02/14/2023    PLT 472 (H) 02/14/2023      No results found for: ''OSMOLALITY''   Lab Results   Component Value Date    KETONESUR Negative 04/13/2023      No results found for: ''PHVEN'', ''PHART''   Lab Results   Component Value Date    CHOL 280 02/04/2024    CHOLHDL 44 (L) 02/04/2024    CHOLDLCAL 209 (H) 02/04/2024    TRIGLY 135 02/04/2024      No results for input(s): ''GLUCOSE'' in the last 72 hours.   Recent Labs     03/25/24  1619   GLUCOSEPOC 113*      No results for input(s): ''INSULIN'', ''CPEPTIDE'' in the last 72 hours.    Assessment/Plan:    49 y.o. female with with PMHx HTN, HLD, morbid obesity, OSA, type 2 diabetes mellitus who presents for treatment of T2DM.     1. Type 2 Diabetes Mellitus  Lab Results   Component Value Date    HGBA1C 5.5 03/25/2024    HGBA1C 5.9 (H) 06/17/2023    HGBA1C 6.3 (H) 02/14/2023     Controlled T2DM.   DM complications include none.   A1C goal < 7%: to reduce risk of diabetes related complications to the eyes, feet and kidneys with additional goal of hypoglycemia risk reduction.    -Increase Mounjaro  15mg  once weekly   -Discussed the risks and benefits of the medication including nausea, vomiting, pancreatitis, and pancreatic cell neoplasia   -Reviewed dietary modifications    -Exercise as tolerated   -Continue glucose monitoring - Patient to message me on her preferred glucometer   -Check labs, including BMP, LFTs, lipid profile, UMA, TSH   -Annual ophthalmic exam   -DM educator - Consider at next visit   -Discussed risk of  long-term microvascular complications in relation to diabetes control / HbA1C   -Counseled on basic DM physiology and glucose/insulin physiology     2. Hypertension   -Continue current regimen, including ACE-I inhibitor or ARB therapy        3. Hyperlipidemia   Per AHA guidelines, statin therapy is recommended with history of diabetes mellitus   -Restart lipitor  (brand) 20mg  daily    4. Morbid Obesity Body mass index is 46.52 kg/m?.  - Counseled on weight loss, exercise, and nutrition   - Increase mounjaro  as above  - PRO referral placed    5. Elevated cortisol levels  LNSCx2 elevated although patient does not recall if this was done at 11pm. 1mg  DST normal  - Repeat LNSCx2    6. Diabetic microvascular disease screening:  There are no preventive care reminders to display for this patient.    Orders Placed This Encounter    Cortisol by LC-MS/MS, Salivary    Cortisol by LC-MS/MS, Salivary    Comprehensive Metabolic Panel    Albumin/Creat Ratio Ur    Hgb A1c    Lipid Panel    TSH    Free T4    Referral to Program for Reducing Obesity (PRO)    POCT Glycosylated Hemoglobin (Hb A1C)    POCT glucose    LIPITOR  20 MG tablet    tirzepatide  (MOUNJARO ) 15 mg/0.5 mL SC injection       Return in about 3 months (around 06/23/2024).      42 minutes were spent with the  patient and in patient related care coordination. Office visit time was devoted to counseling the patient, reviewing previous tests, discussing treatment options, developing a follow up plan, ordering additional lab testing and documenting encounter. Time does not include CGM interpretation if performed today.   [x]  Preparing to see the patient (e.g., review of tests)  [x]  Obtaining and or reviewing separately obtained history   [x]  Performing a medically appropriate examination and/or evaluation   [x]  Counseling and educating the patient/family/caregiver   [x]  Ordering medications, tests, or procedures  []  Referring and communicating with other healthcare professionals (when not separately reported)  [x]  Documenting clinical information in the EHR  [x]  Independently interpreting results and communicating results to patient/ family/caregiver       Author: Doyal PARAS. Tina, MD 03/25/2024 5:11 PM    Patient Instructions   Please go to the lab to pick up two salivary cortisol kits.  Collect two saliva specimens between 11 PM and 12 AM on two separate nights.  Do not brush your teeth before hand.  Do not smoke, chew tobacco, or eat licorice beforehand as this could create false levels.  Return the kits to the lab after your two collections.

## 2024-03-25 NOTE — Patient Instructions
 Please go to the lab to pick up two salivary cortisol kits.  Collect two saliva specimens between 11 PM and 12 AM on two separate nights.  Do not brush your teeth before hand.  Do not smoke, chew tobacco, or eat licorice beforehand as this could create false levels.  Return the kits to the lab after your two collections.

## 2024-03-26 ENCOUNTER — Other Ambulatory Visit: Payer: PRIVATE HEALTH INSURANCE

## 2024-03-26 MED ORDER — TIRZEPATIDE 15 MG/0.5ML SC SOAJ
15 mg | SUBCUTANEOUS | 3 refills | 28.00000 days | Status: AC
Start: 2024-03-26 — End: ?

## 2024-03-26 MED ORDER — LIPITOR 20 MG PO TABS
20 mg | ORAL_TABLET | Freq: Every evening | ORAL | 1 refills | 90.00000 days | Status: AC
Start: 2024-03-26 — End: ?

## 2024-03-27 ENCOUNTER — Other Ambulatory Visit: Payer: BC Managed Care – HMO
